# Patient Record
Sex: Female | Born: 1970 | Race: Black or African American | Hispanic: No | Marital: Married | Smoking: Never smoker
Health system: Southern US, Community
[De-identification: ages and names within clinical notes are randomized; demographics above are authoritative.]

## PROBLEM LIST (undated history)

## (undated) DIAGNOSIS — D649 Anemia, unspecified: Secondary | ICD-10-CM

## (undated) DIAGNOSIS — O09529 Supervision of elderly multigravida, unspecified trimester: Secondary | ICD-10-CM

## (undated) DIAGNOSIS — E669 Obesity, unspecified: Secondary | ICD-10-CM

## (undated) DIAGNOSIS — R87629 Unspecified abnormal cytological findings in specimens from vagina: Secondary | ICD-10-CM

## (undated) HISTORY — DX: Obesity, unspecified: E66.9

## (undated) HISTORY — PX: NO PAST SURGERIES: SHX2092

## (undated) HISTORY — DX: Unspecified abnormal cytological findings in specimens from vagina: R87.629

## (undated) HISTORY — DX: Anemia, unspecified: D64.9

## (undated) HISTORY — DX: Supervision of elderly multigravida, unspecified trimester: O09.529

---

## 2015-02-24 ENCOUNTER — Ambulatory Visit (HOSPITAL_COMMUNITY)
Admission: RE | Admit: 2015-02-24 | Discharge: 2015-02-24 | Disposition: A | Discharge status: Home / Self Care | Payer: Medicaid Other | Source: Ambulatory Visit | Attending: Physician Assistant | Admitting: Physician Assistant

## 2015-02-24 ENCOUNTER — Encounter (HOSPITAL_COMMUNITY): Payer: Self-pay

## 2015-02-24 ENCOUNTER — Other Ambulatory Visit (HOSPITAL_COMMUNITY): Payer: Self-pay | Admitting: Physician Assistant

## 2015-02-24 DIAGNOSIS — Z3689 Encounter for other specified antenatal screening: Secondary | ICD-10-CM

## 2015-02-24 DIAGNOSIS — Z36 Encounter for antenatal screening of mother: Secondary | ICD-10-CM | POA: Diagnosis not present

## 2015-02-24 DIAGNOSIS — O09523 Supervision of elderly multigravida, third trimester: Secondary | ICD-10-CM | POA: Diagnosis not present

## 2015-02-24 DIAGNOSIS — Z3A34 34 weeks gestation of pregnancy: Secondary | ICD-10-CM | POA: Insufficient documentation

## 2015-02-24 LAB — OB RESULTS CONSOLE ABO/RH: RH Type: POSITIVE

## 2015-02-24 LAB — OB RESULTS CONSOLE RUBELLA ANTIBODY, IGM: RUBELLA: IMMUNE

## 2015-02-24 LAB — OB RESULTS CONSOLE RPR: RPR: NONREACTIVE

## 2015-02-24 LAB — OB RESULTS CONSOLE GC/CHLAMYDIA
Chlamydia: NEGATIVE
Gonorrhea: NEGATIVE

## 2015-02-24 LAB — OB RESULTS CONSOLE HIV ANTIBODY (ROUTINE TESTING): HIV: NONREACTIVE

## 2015-02-24 LAB — OB RESULTS CONSOLE ANTIBODY SCREEN: ANTIBODY SCREEN: NEGATIVE

## 2015-02-24 LAB — OB RESULTS CONSOLE HEPATITIS B SURFACE ANTIGEN: HEP B S AG: NEGATIVE

## 2015-03-07 ENCOUNTER — Other Ambulatory Visit (HOSPITAL_COMMUNITY): Payer: Self-pay | Admitting: Nurse Practitioner

## 2015-03-07 DIAGNOSIS — Z3A38 38 weeks gestation of pregnancy: Secondary | ICD-10-CM

## 2015-03-07 DIAGNOSIS — O0933 Supervision of pregnancy with insufficient antenatal care, third trimester: Secondary | ICD-10-CM

## 2015-03-07 DIAGNOSIS — Z3A36 36 weeks gestation of pregnancy: Secondary | ICD-10-CM

## 2015-03-07 DIAGNOSIS — O09523 Supervision of elderly multigravida, third trimester: Secondary | ICD-10-CM

## 2015-03-07 DIAGNOSIS — Z3A37 37 weeks gestation of pregnancy: Secondary | ICD-10-CM

## 2015-03-07 LAB — OB RESULTS CONSOLE GBS: STREP GROUP B AG: NEGATIVE

## 2015-03-07 LAB — OB RESULTS CONSOLE GC/CHLAMYDIA
CHLAMYDIA, DNA PROBE: NEGATIVE
GC PROBE AMP, GENITAL: NEGATIVE

## 2015-03-10 ENCOUNTER — Ambulatory Visit (HOSPITAL_COMMUNITY)
Admission: RE | Admit: 2015-03-10 | Discharge: 2015-03-10 | Disposition: A | Discharge status: Home / Self Care | Payer: Medicaid Other | Source: Ambulatory Visit | Attending: Physician Assistant | Admitting: Physician Assistant

## 2015-03-10 ENCOUNTER — Encounter (HOSPITAL_COMMUNITY): Payer: Self-pay

## 2015-03-10 DIAGNOSIS — O0933 Supervision of pregnancy with insufficient antenatal care, third trimester: Secondary | ICD-10-CM | POA: Insufficient documentation

## 2015-03-10 DIAGNOSIS — Z3A36 36 weeks gestation of pregnancy: Secondary | ICD-10-CM | POA: Insufficient documentation

## 2015-03-10 DIAGNOSIS — O09523 Supervision of elderly multigravida, third trimester: Secondary | ICD-10-CM | POA: Insufficient documentation

## 2015-03-16 ENCOUNTER — Other Ambulatory Visit (HOSPITAL_COMMUNITY): Payer: Self-pay | Admitting: Nurse Practitioner

## 2015-03-16 ENCOUNTER — Ambulatory Visit (HOSPITAL_COMMUNITY)
Admission: RE | Admit: 2015-03-16 | Discharge: 2015-03-16 | Disposition: A | Discharge status: Home / Self Care | Payer: Medicaid Other | Source: Ambulatory Visit | Attending: Nurse Practitioner | Admitting: Nurse Practitioner

## 2015-03-16 DIAGNOSIS — O09523 Supervision of elderly multigravida, third trimester: Secondary | ICD-10-CM

## 2015-03-16 DIAGNOSIS — Z3A37 37 weeks gestation of pregnancy: Secondary | ICD-10-CM

## 2015-03-16 DIAGNOSIS — O0933 Supervision of pregnancy with insufficient antenatal care, third trimester: Secondary | ICD-10-CM | POA: Diagnosis not present

## 2015-03-17 ENCOUNTER — Ambulatory Visit (HOSPITAL_COMMUNITY): Admission: RE | Admit: 2015-03-17 | Payer: Self-pay | Source: Ambulatory Visit

## 2015-03-17 ENCOUNTER — Other Ambulatory Visit (HOSPITAL_COMMUNITY): Payer: Self-pay

## 2015-03-21 ENCOUNTER — Telehealth (HOSPITAL_COMMUNITY): Payer: Self-pay | Admitting: *Deleted

## 2015-03-21 ENCOUNTER — Encounter (HOSPITAL_COMMUNITY): Payer: Self-pay | Admitting: *Deleted

## 2015-03-21 NOTE — Telephone Encounter (Signed)
Preadmission screen Interpreter number [phone]

## 2015-03-24 ENCOUNTER — Encounter (HOSPITAL_COMMUNITY): Payer: Self-pay

## 2015-03-24 ENCOUNTER — Ambulatory Visit (HOSPITAL_COMMUNITY)
Admission: RE | Admit: 2015-03-24 | Discharge: 2015-03-24 | Disposition: A | Discharge status: Home / Self Care | Payer: Medicaid Other | Source: Ambulatory Visit | Attending: Physician Assistant | Admitting: Physician Assistant

## 2015-03-24 DIAGNOSIS — O09523 Supervision of elderly multigravida, third trimester: Secondary | ICD-10-CM | POA: Diagnosis present

## 2015-03-24 DIAGNOSIS — Z3A38 38 weeks gestation of pregnancy: Secondary | ICD-10-CM | POA: Diagnosis not present

## 2015-03-24 DIAGNOSIS — O0933 Supervision of pregnancy with insufficient antenatal care, third trimester: Secondary | ICD-10-CM | POA: Insufficient documentation

## 2015-03-26 ENCOUNTER — Encounter (HOSPITAL_COMMUNITY): Payer: Self-pay | Admitting: *Deleted

## 2015-03-26 ENCOUNTER — Inpatient Hospital Stay (HOSPITAL_COMMUNITY)
Admission: AD | Admit: 2015-03-26 | Discharge: 2015-03-29 | DRG: 766 | Disposition: A | Discharge status: Home / Self Care | Payer: Medicaid Other | Source: Ambulatory Visit | Attending: Obstetrics and Gynecology | Admitting: Obstetrics and Gynecology

## 2015-03-26 DIAGNOSIS — O09513 Supervision of elderly primigravida, third trimester: Secondary | ICD-10-CM

## 2015-03-26 DIAGNOSIS — O09612 Supervision of young primigravida, second trimester: Secondary | ICD-10-CM | POA: Diagnosis not present

## 2015-03-26 DIAGNOSIS — Z8249 Family history of ischemic heart disease and other diseases of the circulatory system: Secondary | ICD-10-CM

## 2015-03-26 DIAGNOSIS — O99214 Obesity complicating childbirth: Secondary | ICD-10-CM | POA: Diagnosis present

## 2015-03-26 DIAGNOSIS — E669 Obesity, unspecified: Secondary | ICD-10-CM | POA: Diagnosis present

## 2015-03-26 DIAGNOSIS — O328XX0 Maternal care for other malpresentation of fetus, not applicable or unspecified: Secondary | ICD-10-CM | POA: Diagnosis not present

## 2015-03-26 DIAGNOSIS — R319 Hematuria, unspecified: Secondary | ICD-10-CM | POA: Diagnosis present

## 2015-03-26 DIAGNOSIS — R87810 Cervical high risk human papillomavirus (HPV) DNA test positive: Secondary | ICD-10-CM | POA: Diagnosis present

## 2015-03-26 DIAGNOSIS — Z803 Family history of malignant neoplasm of breast: Secondary | ICD-10-CM

## 2015-03-26 DIAGNOSIS — O9902 Anemia complicating childbirth: Secondary | ICD-10-CM | POA: Diagnosis present

## 2015-03-26 DIAGNOSIS — O09529 Supervision of elderly multigravida, unspecified trimester: Secondary | ICD-10-CM

## 2015-03-26 DIAGNOSIS — Z98891 History of uterine scar from previous surgery: Secondary | ICD-10-CM

## 2015-03-26 DIAGNOSIS — Z6837 Body mass index (BMI) 37.0-37.9, adult: Secondary | ICD-10-CM

## 2015-03-26 DIAGNOSIS — O99814 Abnormal glucose complicating childbirth: Secondary | ICD-10-CM | POA: Diagnosis present

## 2015-03-26 DIAGNOSIS — Z3A39 39 weeks gestation of pregnancy: Secondary | ICD-10-CM | POA: Diagnosis present

## 2015-03-26 DIAGNOSIS — O0932 Supervision of pregnancy with insufficient antenatal care, second trimester: Secondary | ICD-10-CM | POA: Diagnosis not present

## 2015-03-26 LAB — CBC
HCT: 28 % — ABNORMAL LOW (ref 36.0–46.0)
HEMOGLOBIN: 9.4 g/dL — AB (ref 12.0–15.0)
MCH: 27.6 pg (ref 26.0–34.0)
MCHC: 33.6 g/dL (ref 30.0–36.0)
MCV: 82.4 fL (ref 78.0–100.0)
PLATELETS: 301 10*3/uL (ref 150–400)
RBC: 3.4 MIL/uL — AB (ref 3.87–5.11)
RDW: 14.1 % (ref 11.5–15.5)
WBC: 6.4 10*3/uL (ref 4.0–10.5)

## 2015-03-26 LAB — ABO/RH
ABO/RH(D): O POS
Weak D: POSITIVE

## 2015-03-26 MED ORDER — OXYCODONE-ACETAMINOPHEN 5-325 MG PO TABS: 1.0000 | ORAL_TABLET | ORAL | Status: DC | PRN

## 2015-03-26 MED ORDER — LACTATED RINGERS IV SOLN
500.0000 mL | INTRAVENOUS | Status: DC | PRN
  Administered 2015-03-27 (×5): 500 mL via INTRAVENOUS

## 2015-03-26 MED ORDER — LACTATED RINGERS IV SOLN
INTRAVENOUS | Status: DC
  Administered 2015-03-26 – 2015-03-27 (×6): via INTRAVENOUS

## 2015-03-26 MED ORDER — OXYTOCIN 40 UNITS IN LACTATED RINGERS INFUSION - SIMPLE MED
62.5000 mL/h | INTRAVENOUS | Status: DC
  Filled 2015-03-26 (×2): qty 1000

## 2015-03-26 MED ORDER — FLEET ENEMA 7-19 GM/118ML RE ENEM: 1.0000 | ENEMA | RECTAL | Status: DC | PRN

## 2015-03-26 MED ORDER — CITRIC ACID-SODIUM CITRATE 334-500 MG/5ML PO SOLN
30.0000 mL | ORAL | Status: DC | PRN
  Administered 2015-03-27: 30 mL via ORAL
  Filled 2015-03-26: qty 15

## 2015-03-26 MED ORDER — OXYCODONE-ACETAMINOPHEN 5-325 MG PO TABS: 2.0000 | ORAL_TABLET | ORAL | Status: DC | PRN

## 2015-03-26 MED ORDER — ONDANSETRON HCL 4 MG/2ML IJ SOLN
4.0000 mg | Freq: Four times a day (QID) | INTRAMUSCULAR | Status: DC | PRN
  Administered 2015-03-27: 4 mg via INTRAVENOUS

## 2015-03-26 MED ORDER — ZOLPIDEM TARTRATE 5 MG PO TABS
5.0000 mg | ORAL_TABLET | Freq: Once | ORAL | Status: DC
Start: 2015-03-26 — End: 2015-03-27

## 2015-03-26 MED ORDER — FENTANYL CITRATE (PF) 100 MCG/2ML IJ SOLN: 100.0000 ug | INTRAMUSCULAR | Status: DC | PRN

## 2015-03-26 MED ORDER — MISOPROSTOL 25 MCG QUARTER TABLET
25.0000 ug | ORAL_TABLET | ORAL | Status: DC
  Administered 2015-03-26: 25 ug via VAGINAL
  Filled 2015-03-26 (×4): qty 1
  Filled 2015-03-26: qty 0.25

## 2015-03-26 MED ORDER — LIDOCAINE HCL (PF) 1 % IJ SOLN
30.0000 mL | INTRAMUSCULAR | Status: DC | PRN
  Filled 2015-03-26: qty 30

## 2015-03-26 MED ORDER — ACETAMINOPHEN 325 MG PO TABS: 650.0000 mg | ORAL_TABLET | ORAL | Status: DC | PRN

## 2015-03-26 MED ORDER — OXYTOCIN 40 UNITS IN LACTATED RINGERS INFUSION - SIMPLE MED
1.0000 m[IU]/min | INTRAVENOUS | Status: DC
  Administered 2015-03-26: 2 m[IU]/min via INTRAVENOUS

## 2015-03-26 MED ORDER — OXYTOCIN BOLUS FROM INFUSION: 500.0000 mL | INTRAVENOUS | Status: DC

## 2015-03-26 MED ORDER — HYDROXYZINE HCL 50 MG PO TABS: 50.0000 mg | ORAL_TABLET | Freq: Four times a day (QID) | ORAL | Status: DC | PRN

## 2015-03-26 MED ORDER — TERBUTALINE SULFATE 1 MG/ML IJ SOLN: 0.2500 mg | Freq: Once | INTRAMUSCULAR | Status: DC | PRN

## 2015-03-26 NOTE — Progress Notes (Signed)
Pt unable to make MN appointment due to no child care. Here now and will begin IOL process.

## 2015-03-26 NOTE — H&P (Signed)
Holly Chan is a 44 y.o. G2P1001 female at [redacted]w[redacted]d by LMP c/w 34wk u/s, presenting for IOL d/t AMA.   Reports active fetal movement, contractions: irregular, vaginal bleeding: none, membranes: intact. Initiated prenatal care at [facility] at 34 wks.   Pregnancy complicated by AMA, obesity, elevated 1hr w/ normal 3hr gtt, anemia, ASCUS w/ -HRHPV pap.  H/O term uncomplicated SVB x 1 in [place]  Past Medical History: Past Medical History  Diagnosis Date  . AMA (advanced maternal age) multigravida 35+   . Anemia   . Obesity   . Vaginal Pap smear, abnormal     Past Surgical History: Past Surgical History  Procedure Laterality Date  . No past surgeries      Obstetrical History: OB History    Gravida Para Term Preterm AB TAB SAB Ectopic Multiple Living   0 0 0 0 0 0 1      Social History: Social History   Social History  . Marital Status: Married    Spouse Name: N/A  . Number of Children: N/A  . Years of Education: N/A   Social History Main Topics  . Smoking status: Never Smoker   . Smokeless tobacco: Never Used  . Alcohol Use: No  . Drug Use: No  . Sexual Activity: Yes    Birth Control/ Protection: None   Other Topics Concern  . None   Social History Narrative    Family History: Family History  Problem Relation Age of Onset  . Hypertension Mother   . Cancer Mother     breast  . Hypertension Father     Allergies: No Known Allergies  Prescriptions prior to admission  Medication Sig Dispense Refill Last Dose  . Prenatal Vit-Fe Fumarate-FA (PRENATAL MULTIVITAMIN) TABS tablet Take 1 tablet by mouth daily at 12 noon.   Taking     Review of Systems  Pertinent pos/neg as indicated in HPI    Height  (1.626 m), weight 97.523 kg (215 lb), last menstrual period 06/26/2014. General appearance: alert, cooperative and no distress Lungs: clear to auscultation bilaterally Heart: regular rate and rhythm Abdomen: gravid, soft, non-tender Extremities: Trace  edema DTR's 2+  Fetal monitoring: FHR: 150 bpm, variability: moderate,  Accelerations: Present,  decelerations:  Absent Uterine activity: irregular    Presentation: cephalic   Prenatal labs: ABO, Rh: O/Positive/-- (08/04 0000) Antibody: Negative (08/04 0000) Rubella:  Immune RPR: Nonreactive (08/04 0000)  HBsAg: Negative (08/04 0000)  HIV: Non-reactive (08/04 0000)  GBS: Negative (08/15 0000)   1 hr Glucola: 170 3hr GTT: 88/159/172/137 Genetic screening:  Too late Anatomy US: normal female  Assessment:  [redacted]w[redacted]d SIUP  G2P1001  IOL for AMA  Cat 1 FHR  GBS Negative (08/15 0000)   Late PNC  Plan:  Admit to BS  IV pain meds/epidural prn active labor  Pitocin per protocol  Anticipate NSVB    [name] CNM, WHNP-BC 03/26/2015, 10:08 AM

## 2015-03-26 NOTE — Progress Notes (Signed)
Patient ID: Holly Chan, female   DOB: 09/17/1970, 44 y.o.   MRN: [id number] Holly Chan is a 44 y.o. G2P1001 at [redacted]w[redacted]d admitted for induction of labor due to [facility].  Subjective: Feeling contractions, not uncomfortable  Objective: BP 106/71 mmHg  Pulse 67  Temp(Src) 98.4 F (36.9 C) (Oral)  Resp 15  Ht  (1.626 m)  Wt 97.523 kg (215 lb)  BMI 36.89 kg/m2  LMP 06/26/2014 (Exact Date)    FHT:  FHR: 135 bpm, variability: moderate,  accelerations:  Present,  decelerations:  Absent UC:   regular, every 2-4 minutes  SVE:   Dilation: 3 Effacement (%): 40 Station: -2 Exam by:: K Booker CNM  Pitocin @ 8 mu/min  Labs: Lab Results  Component Value Date   WBC 6.4 03/26/2015   HGB 9.4* 03/26/2015   HCT 28.0* 03/26/2015   MCV 82.4 03/26/2015   PLT 301 03/26/2015    Assessment / Plan: IOL for AMA, pitocin @ 64mu/min, no cervical change- will continue increasing pitocin per protocol, then arom  Labor: early Fetal Wellbeing:  Category I Pain Control:  Labor support without medications Pre-eclampsia: n/a I/D:  n/a Anticipated MOD:  NSVD  [name] CNM, WHNP-BC 03/26/2015, 3:30 PM

## 2015-03-27 ENCOUNTER — Inpatient Hospital Stay (HOSPITAL_COMMUNITY): Payer: Medicaid Other | Admitting: Anesthesiology

## 2015-03-27 ENCOUNTER — Encounter (HOSPITAL_COMMUNITY): Payer: Self-pay | Admitting: Anesthesiology

## 2015-03-27 ENCOUNTER — Encounter (HOSPITAL_COMMUNITY)
Admission: AD | Disposition: A | Discharge status: Home / Self Care | Payer: Self-pay | Source: Ambulatory Visit | Attending: Obstetrics and Gynecology

## 2015-03-27 DIAGNOSIS — Z3A24 24 weeks gestation of pregnancy: Secondary | ICD-10-CM

## 2015-03-27 DIAGNOSIS — O09612 Supervision of young primigravida, second trimester: Secondary | ICD-10-CM

## 2015-03-27 DIAGNOSIS — O0932 Supervision of pregnancy with insufficient antenatal care, second trimester: Secondary | ICD-10-CM

## 2015-03-27 DIAGNOSIS — O328XX0 Maternal care for other malpresentation of fetus, not applicable or unspecified: Secondary | ICD-10-CM

## 2015-03-27 LAB — RPR: RPR Ser Ql: NONREACTIVE

## 2015-03-27 LAB — HIV ANTIBODY (ROUTINE TESTING W REFLEX): HIV Screen 4th Generation wRfx: NONREACTIVE

## 2015-03-27 LAB — PREPARE RBC (CROSSMATCH)

## 2015-03-27 SURGERY — Surgical Case
Anesthesia: Epidural | Site: Abdomen

## 2015-03-27 MED ORDER — MORPHINE SULFATE 0.5 MG/ML IJ SOLN
INTRAMUSCULAR | Status: AC
  Filled 2015-03-27: qty 100

## 2015-03-27 MED ORDER — ONDANSETRON HCL 4 MG/2ML IJ SOLN
INTRAMUSCULAR | Status: AC
  Filled 2015-03-27: qty 2

## 2015-03-27 MED ORDER — DIBUCAINE 1 % RE OINT: 1.0000 "application " | TOPICAL_OINTMENT | RECTAL | Status: DC | PRN

## 2015-03-27 MED ORDER — NALBUPHINE HCL 10 MG/ML IJ SOLN
5.0000 mg | INTRAMUSCULAR | Status: DC | PRN
  Filled 2015-03-27: qty 0.5

## 2015-03-27 MED ORDER — MENTHOL 3 MG MT LOZG: 1.0000 | LOZENGE | OROMUCOSAL | Status: DC | PRN

## 2015-03-27 MED ORDER — ACETAMINOPHEN 325 MG PO TABS: 650.0000 mg | ORAL_TABLET | ORAL | Status: DC | PRN

## 2015-03-27 MED ORDER — DIPHENHYDRAMINE HCL 25 MG PO CAPS: 25.0000 mg | ORAL_CAPSULE | Freq: Four times a day (QID) | ORAL | Status: DC | PRN

## 2015-03-27 MED ORDER — CEFAZOLIN SODIUM-DEXTROSE 2-3 GM-% IV SOLR
INTRAVENOUS | Status: AC
  Filled 2015-03-27: qty 50

## 2015-03-27 MED ORDER — SIMETHICONE 80 MG PO CHEW
80.0000 mg | CHEWABLE_TABLET | ORAL | Status: DC
  Administered 2015-03-28: 80 mg via ORAL
  Filled 2015-03-27: qty 1

## 2015-03-27 MED ORDER — NALOXONE HCL 1 MG/ML IJ SOLN: 1.0000 ug/kg/h | INTRAVENOUS | Status: DC | PRN

## 2015-03-27 MED ORDER — MORPHINE SULFATE (PF) 0.5 MG/ML IJ SOLN
INTRAMUSCULAR | Status: DC | PRN
  Administered 2015-03-27: 3 mg via EPIDURAL

## 2015-03-27 MED ORDER — SODIUM BICARBONATE 8.4 % IV SOLN
INTRAVENOUS | Status: DC | PRN
  Administered 2015-03-27: 5 mL via EPIDURAL

## 2015-03-27 MED ORDER — TERBUTALINE SULFATE 1 MG/ML IJ SOLN
0.2500 mg | Freq: Once | INTRAMUSCULAR | Status: AC | PRN
  Administered 2015-03-27: 0.25 mg via SUBCUTANEOUS
  Filled 2015-03-27: qty 1

## 2015-03-27 MED ORDER — DIPHENHYDRAMINE HCL 50 MG/ML IJ SOLN: 12.5000 mg | INTRAMUSCULAR | Status: DC | PRN

## 2015-03-27 MED ORDER — SODIUM BICARBONATE 8.4 % IV SOLN
INTRAVENOUS | Status: AC
  Filled 2015-03-27: qty 50

## 2015-03-27 MED ORDER — NALBUPHINE HCL 10 MG/ML IJ SOLN
5.0000 mg | Freq: Once | INTRAMUSCULAR | Status: DC | PRN
  Filled 2015-03-27: qty 0.5

## 2015-03-27 MED ORDER — OXYTOCIN 10 UNIT/ML IJ SOLN
40.0000 [IU] | INTRAVENOUS | Status: DC | PRN
  Administered 2015-03-27: 40 [IU] via INTRAVENOUS

## 2015-03-27 MED ORDER — SODIUM CHLORIDE 0.9 % IJ SOLN: 3.0000 mL | INTRAMUSCULAR | Status: DC | PRN

## 2015-03-27 MED ORDER — PRENATAL MULTIVITAMIN CH
1.0000 | ORAL_TABLET | Freq: Every day | ORAL | Status: DC
  Administered 2015-03-28 – 2015-03-29 (×2): 1 via ORAL
  Filled 2015-03-27 (×2): qty 1

## 2015-03-27 MED ORDER — WITCH HAZEL-GLYCERIN EX PADS: 1.0000 "application " | MEDICATED_PAD | CUTANEOUS | Status: DC | PRN

## 2015-03-27 MED ORDER — ZOLPIDEM TARTRATE 5 MG PO TABS: 5.0000 mg | ORAL_TABLET | Freq: Every evening | ORAL | Status: DC | PRN

## 2015-03-27 MED ORDER — NALOXONE HCL 0.4 MG/ML IJ SOLN: 0.4000 mg | INTRAMUSCULAR | Status: DC | PRN

## 2015-03-27 MED ORDER — IBUPROFEN 600 MG PO TABS
600.0000 mg | ORAL_TABLET | Freq: Four times a day (QID) | ORAL | Status: DC
  Administered 2015-03-28 – 2015-03-29 (×5): 600 mg via ORAL
  Filled 2015-03-27 (×5): qty 1

## 2015-03-27 MED ORDER — LANOLIN HYDROUS EX OINT: 1.0000 "application " | TOPICAL_OINTMENT | CUTANEOUS | Status: DC | PRN

## 2015-03-27 MED ORDER — LIDOCAINE-EPINEPHRINE (PF) 2 %-1:200000 IJ SOLN
INTRAMUSCULAR | Status: AC
  Filled 2015-03-27: qty 20

## 2015-03-27 MED ORDER — SCOPOLAMINE 1 MG/3DAYS TD PT72
MEDICATED_PATCH | TRANSDERMAL | Status: AC
  Filled 2015-03-27: qty 1

## 2015-03-27 MED ORDER — LIDOCAINE HCL (PF) 1 % IJ SOLN
INTRAMUSCULAR | Status: DC | PRN
  Administered 2015-03-27 (×2): 4 mL via EPIDURAL

## 2015-03-27 MED ORDER — OXYTOCIN 40 UNITS IN LACTATED RINGERS INFUSION - SIMPLE MED: 62.5000 mL/h | INTRAVENOUS | Status: AC

## 2015-03-27 MED ORDER — CEFAZOLIN SODIUM-DEXTROSE 2-3 GM-% IV SOLR
INTRAVENOUS | Status: DC | PRN
  Administered 2015-03-27: 2 g via INTRAVENOUS

## 2015-03-27 MED ORDER — OXYTOCIN 10 UNIT/ML IJ SOLN
INTRAMUSCULAR | Status: AC
  Filled 2015-03-27: qty 4

## 2015-03-27 MED ORDER — OXYCODONE-ACETAMINOPHEN 5-325 MG PO TABS
1.0000 | ORAL_TABLET | ORAL | Status: DC | PRN
  Administered 2015-03-29: 1 via ORAL
  Filled 2015-03-27: qty 1

## 2015-03-27 MED ORDER — DIPHENHYDRAMINE HCL 25 MG PO CAPS: 25.0000 mg | ORAL_CAPSULE | ORAL | Status: DC | PRN

## 2015-03-27 MED ORDER — SENNOSIDES-DOCUSATE SODIUM 8.6-50 MG PO TABS
2.0000 | ORAL_TABLET | ORAL | Status: DC
  Administered 2015-03-28: 2 via ORAL
  Filled 2015-03-27: qty 2

## 2015-03-27 MED ORDER — EPHEDRINE 5 MG/ML INJ
10.0000 mg | INTRAVENOUS | Status: DC | PRN
  Administered 2015-03-27: 10 mg via INTRAVENOUS
  Filled 2015-03-27: qty 4

## 2015-03-27 MED ORDER — FENTANYL 2.5 MCG/ML BUPIVACAINE 1/10 % EPIDURAL INFUSION (WH - ANES)
14.0000 mL/h | INTRAMUSCULAR | Status: DC | PRN
  Administered 2015-03-27 (×2): 14 mL/h via EPIDURAL
  Filled 2015-03-27 (×2): qty 125

## 2015-03-27 MED ORDER — OXYTOCIN 40 UNITS IN LACTATED RINGERS INFUSION - SIMPLE MED
1.0000 m[IU]/min | INTRAVENOUS | Status: DC
  Administered 2015-03-27: 2 m[IU]/min via INTRAVENOUS
  Administered 2015-03-27: 4 m[IU]/min via INTRAVENOUS

## 2015-03-27 MED ORDER — PROMETHAZINE HCL 25 MG/ML IJ SOLN
6.2500 mg | Freq: Once | INTRAMUSCULAR | Status: DC | PRN
  Administered 2015-03-27: 6.25 mg via INTRAVENOUS
  Filled 2015-03-27: qty 1

## 2015-03-27 MED ORDER — LACTATED RINGERS IV SOLN
INTRAVENOUS | Status: DC
  Administered 2015-03-27 – 2015-03-28 (×2): via INTRAVENOUS

## 2015-03-27 MED ORDER — PHENYLEPHRINE 40 MCG/ML (10ML) SYRINGE FOR IV PUSH (FOR BLOOD PRESSURE SUPPORT): 80.0000 ug | PREFILLED_SYRINGE | INTRAVENOUS | Status: DC | PRN

## 2015-03-27 MED ORDER — KETOROLAC TROMETHAMINE 30 MG/ML IJ SOLN
30.0000 mg | Freq: Four times a day (QID) | INTRAMUSCULAR | Status: AC | PRN
  Administered 2015-03-27 – 2015-03-28 (×2): 30 mg via INTRAVENOUS
  Filled 2015-03-27 (×2): qty 1

## 2015-03-27 MED ORDER — KETOROLAC TROMETHAMINE 30 MG/ML IJ SOLN: 30.0000 mg | Freq: Four times a day (QID) | INTRAMUSCULAR | Status: AC | PRN

## 2015-03-27 MED ORDER — NALBUPHINE HCL 10 MG/ML IJ SOLN: 5.0000 mg | Freq: Once | INTRAMUSCULAR | Status: DC | PRN

## 2015-03-27 MED ORDER — TETANUS-DIPHTH-ACELL PERTUSSIS 5-2.5-18.5 LF-MCG/0.5 IM SUSP: 0.5000 mL | Freq: Once | INTRAMUSCULAR | Status: DC

## 2015-03-27 MED ORDER — PHENYLEPHRINE 40 MCG/ML (10ML) SYRINGE FOR IV PUSH (FOR BLOOD PRESSURE SUPPORT)
80.0000 ug | PREFILLED_SYRINGE | INTRAVENOUS | Status: DC | PRN
  Filled 2015-03-27 (×2): qty 20

## 2015-03-27 MED ORDER — SIMETHICONE 80 MG PO CHEW: 80.0000 mg | CHEWABLE_TABLET | ORAL | Status: DC | PRN

## 2015-03-27 MED ORDER — SIMETHICONE 80 MG PO CHEW
80.0000 mg | CHEWABLE_TABLET | Freq: Three times a day (TID) | ORAL | Status: DC
  Administered 2015-03-28 – 2015-03-29 (×4): 80 mg via ORAL
  Filled 2015-03-27 (×2): qty 1

## 2015-03-27 MED ORDER — OXYCODONE-ACETAMINOPHEN 5-325 MG PO TABS: 2.0000 | ORAL_TABLET | ORAL | Status: DC | PRN

## 2015-03-27 MED ORDER — SCOPOLAMINE 1 MG/3DAYS TD PT72
MEDICATED_PATCH | TRANSDERMAL | Status: DC | PRN
  Administered 2015-03-27: 1 via TRANSDERMAL

## 2015-03-27 MED ORDER — FENTANYL 2.5 MCG/ML BUPIVACAINE 1/10 % EPIDURAL INFUSION (WH - ANES): 14.0000 mL/h | INTRAMUSCULAR | Status: DC | PRN

## 2015-03-27 MED ORDER — LACTATED RINGERS IV SOLN
INTRAVENOUS | Status: DC | PRN
  Administered 2015-03-27: 17:00:00 via INTRAVENOUS

## 2015-03-27 MED ORDER — PROMETHAZINE HCL 25 MG/ML IJ SOLN: 6.2500 mg | INTRAMUSCULAR | Status: DC | PRN

## 2015-03-27 MED ORDER — MEPERIDINE HCL 25 MG/ML IJ SOLN: 6.2500 mg | INTRAMUSCULAR | Status: DC | PRN

## 2015-03-27 MED ORDER — ONDANSETRON HCL 4 MG/2ML IJ SOLN: 4.0000 mg | Freq: Three times a day (TID) | INTRAMUSCULAR | Status: DC | PRN

## 2015-03-27 SURGICAL SUPPLY — 33 items
BENZOIN TINCTURE PRP APPL 2/3 (GAUZE/BANDAGES/DRESSINGS) ×3 IMPLANT
CLAMP CORD UMBIL (MISCELLANEOUS) IMPLANT
CLOSURE WOUND 1/2 X4 (GAUZE/BANDAGES/DRESSINGS) ×1
CLOTH BEACON ORANGE TIMEOUT ST (SAFETY) ×3 IMPLANT
DRAPE SHEET LG 3/4 BI-LAMINATE (DRAPES) IMPLANT
DRSG OPSITE POSTOP 4X10 (GAUZE/BANDAGES/DRESSINGS) ×3 IMPLANT
DURAPREP 26ML APPLICATOR (WOUND CARE) ×3 IMPLANT
ELECT REM PT RETURN 9FT ADLT (ELECTROSURGICAL) ×3
ELECTRODE REM PT RTRN 9FT ADLT (ELECTROSURGICAL) ×1 IMPLANT
EXTRACTOR VACUUM KIWI (MISCELLANEOUS) IMPLANT
GLOVE BIO SURGEON ST LM GN SZ9 (GLOVE) ×3 IMPLANT
GLOVE BIOGEL PI IND STRL 9 (GLOVE) ×1 IMPLANT
GLOVE BIOGEL PI INDICATOR 9 (GLOVE) ×2
GOWN STRL REUS W/TWL 2XL LVL3 (GOWN DISPOSABLE) ×3 IMPLANT
GOWN STRL REUS W/TWL LRG LVL3 (GOWN DISPOSABLE) ×3 IMPLANT
NEEDLE HYPO 25X5/8 SAFETYGLIDE (NEEDLE) IMPLANT
NS IRRIG 1000ML POUR BTL (IV SOLUTION) ×3 IMPLANT
PACK C SECTION WH (CUSTOM PROCEDURE TRAY) ×3 IMPLANT
PAD OB MATERNITY 4.3X12.25 (PERSONAL CARE ITEMS) ×3 IMPLANT
PENCIL SMOKE EVAC W/HOLSTER (ELECTROSURGICAL) IMPLANT
RTRCTR C-SECT PINK 25CM LRG (MISCELLANEOUS) ×3 IMPLANT
RTRCTR C-SECT PINK 34CM XLRG (MISCELLANEOUS) IMPLANT
STRIP CLOSURE SKIN 1/2X4 (GAUZE/BANDAGES/DRESSINGS) ×2 IMPLANT
SUT MNCRL 0 VIOLET CTX 36 (SUTURE) ×2 IMPLANT
SUT MONOCRYL 0 CTX 36 (SUTURE) ×4
SUT VIC AB 0 CT1 27 (SUTURE) ×2
SUT VIC AB 0 CT1 27XBRD ANBCTR (SUTURE) ×1 IMPLANT
SUT VIC AB 2-0 CT1 27 (SUTURE) ×2
SUT VIC AB 2-0 CT1 TAPERPNT 27 (SUTURE) ×1 IMPLANT
SUT VIC AB 4-0 KS 27 (SUTURE) ×3 IMPLANT
SYR BULB IRRIGATION 50ML (SYRINGE) IMPLANT
TOWEL OR 17X24 6PK STRL BLUE (TOWEL DISPOSABLE) ×3 IMPLANT
TRAY FOLEY CATH SILVER 14FR (SET/KITS/TRAYS/PACK) IMPLANT

## 2015-03-27 NOTE — Progress Notes (Signed)
Kerin Kren is a 44 y.o. G2P1001 at [redacted]w[redacted]d  admitted for induction of labor due to Advanced maternal age..  Subjective: pt having repetitive lates with most all contractions over the last hour, with beat to beat initially fine, but now diminishing at times.  Pt has had adequate labor, with IUPC in place. Cervical change has arrested, was 5.5 at 7:30 , progressed to 6 but no significant further change. Station by me remains -2 or higher. No thrust /descent of vertex with contractions. U/s confirms a rather military position of vertex, LOT.   Bladder urine shows start of hematuria, and foley bulb in bladder able to be pushed above the vertex and deep decel with subsequent contractions Pelvic clinically narrow. Objective: BP 98/64 mmHg  Pulse 68  Temp(Src) 98.8 F (37.1 C) (Oral)  Resp 18  Ht  (1.626 m)  Wt 97.523 kg (215 lb)  BMI 36.89 kg/m2  SpO2 98%  LMP 06/26/2014 (Exact Date)   Total I/O In: -  Out: 1000 [Urine:1000]  FHT:  FHR: 150 bpm, variability: minimal ,  accelerations:  Present,  decelerations:  Present lates UC:   regular, every 3 minutes SVE:   Dilation: 6.5 Effacement (%): 50 Station: -2 Exam by:: Ferg  Labs: Lab Results  Component Value Date   WBC 6.4 03/26/2015   HGB 9.4* 03/26/2015   HCT 28.0* 03/26/2015   MCV 82.4 03/26/2015   PLT 301 03/26/2015    Assessment / Plan: Arrest in active phase of labor, Fetal intolerance of labor  Labor: stopped , given terbutalene for Intrauterine recovery Preeclampsia:   Fetal Wellbeing:  Category II Pain Control:  Epidural I/D:  n/a Anticipated MOD:  Cesarean section recommended, and accepted. risks reviewed thru translator. type and cross initiated due to low hgb. To OR for cesarean.  Avyan Livesay V 03/27/2015, 4:58 PM

## 2015-03-27 NOTE — Transfer of Care (Signed)
Immediate Anesthesia Transfer of Care Note  Patient: Holly Chan  Procedure(s) Performed: Procedure(s): CESAREAN SECTION (N/A)  Patient Location: PACU  Anesthesia Type:Epidural  Level of Consciousness: awake  Airway & Oxygen Therapy: Patient Spontanous Breathing  Post-op Assessment: Report given to RN and Post -op Vital signs reviewed and stable  Post vital signs: stable  Last Vitals:  Filed Vitals:   03/27/15 1701  BP: 116/70  Pulse: 89  Temp:   Resp:     Complications: No apparent anesthesia complications

## 2015-03-27 NOTE — Progress Notes (Signed)
Patient ID: Holly Chan, female   DOB: 04/02/1971, 44 y.o.   MRN: [id number] Holly Chan is a 44 y.o. G2P1001 at [redacted]w[redacted]d admitted for induction of labor due to [facility].   Subjective: Comfortable w/ epidural, no complaints  Objective: BP 94/52 mmHg  Pulse 71  Temp(Src) 98.8 F (37.1 C) (Oral)  Resp 18  Ht  (1.626 m)  Wt 97.523 kg (215 lb)  BMI 36.89 kg/m2  SpO2 98%  LMP 06/26/2014 (Exact Date)    FHT:  FHR: 135 bpm, variability: moderate,  accelerations:  Present,  decelerations:  Present occ variables UC:   3-7  SVE:   Dilation: 5.5 Effacement (%): 90 Station: -2 Exam by:: Joellyn Haff, MD   Labs: Lab Results  Component Value Date   WBC 6.4 03/26/2015   HGB 9.4* 03/26/2015   HCT 28.0* 03/26/2015   MCV 82.4 03/26/2015   PLT 301 03/26/2015    Assessment / Plan: IOL d/t AMA, received 2 doses of pv cytotec, last dose @ 2230, wasn't able to get 0230 dose d/t contracting too much, began hurting and got epidural around 0430- got called by nurse after for variables/lates- bp was 80s/50s although she did have some lower bp's on admit- got a dose of ephedrine- strip is improving  Labor: Progressing normally Fetal Wellbeing:  Category II Pain Control:  Epidural Pre-eclampsia: n/a I/D:  n/a Anticipated MOD:  NSVD  [name] CNM, WHNP-BC 03/27/2015, 7:46 AM

## 2015-03-27 NOTE — Op Note (Signed)
-   03/27/2015  5:55 PM  PATIENT:  Holly Chan  44 y.o. female  PRE-OPERATIVE DIAGNOSIS:  Nonreassuring Fetal Status, CPD, pregnancy 40 weeks  POST-OPERATIVE DIAGNOSIS:  Nonreassuring Fetal Status, CPD  PROCEDURE:  Procedure(s): CESAREAN SECTION (N/A)  SURGEON:  Surgeon(s) and Role:    * Tilda Burrow, MD - Primary  PHYSICIAN ASSISTANT:   ASSISTANTS: none   ANESTHESIA:   epidural  EBL:  Total I/O In: 2000 [I.V.:2000] Out: 1675 [Urine:1075; Blood:600]  BLOOD ADMINISTERED:none  DRAINS: Urinary Catheter (Foley)   LOCAL MEDICATIONS USED:  NONE  SPECIMEN:  Placenta to labor and delivery  DISPOSITION OF SPECIMEN:  Labor and delivery  COUNTS:  YES  TOURNIQUET:  * No tourniquets in log *  DICTATION: .Dragon Dictation  PLAN OF CARE: Has admission orders  PATIENT DISPOSITION:  PACU - hemodynamically stable.   Delay start of Pharmacological VTE agent (>24hrs) due to surgical blood loss or risk of bleeding: not applicable Indications: 44 year old female admitted yesterday for induction of labor for advanced maternal age at EDD, induced reaching 5.5 cm at 7 AM, progressing minimally thereafter developing repetitive late decelerations responsive to intrauterine infusion. This baby is over a pound larger than last and largest delivery 13 years ago, with presenting part at -2 or higher without descent with contractions. Details of procedure: Patient was taken operating room prepped and draped for lower abdominal surgery with epidural anesthesia topped up, tested timeout conducted Ancef administered and transverse uterine incision made on the lower abdomen with sharp dissection to the fascia which was opened transversely and peritoneum opened sharply in the midline. Alexis wound retractor was positioned, transverse uterine incision performed and extended vertically and cephalad and caudad. Amniotic fluid was without malodor. Incision was at the level of the fetal ear. The fetus was  rotated from left occiput transverse position into the incision and delivered with fundal pressure and axillary traction. Cord was clamped and the baby passed to the waiting pediatrician see the notes for details. Placenta delivered easily and response to Pitocin and could a uterine massage. Membranes were intact. The uterus was irrigated. Closure was first layer running locking 0 Monocryl followed by continuous running 0 Monocryl second layer.. Abdomen was irrigated, closed with 2-0 Vicryl, fascia closed with 0 Vicryl, subcutaneous tissues reapproximated with interrupted 2-0 plain times 3 interrupted sutures then subcuticular 4-0 Vicryl skin cllosure completed the procedure. Steri-Strips and benzoin will be applied and patient to recovery room in stable condition with sponge and needles counts correct. EBL quite low estimated 600 cc per anesthesia.

## 2015-03-27 NOTE — Plan of Care (Signed)
Problem: Phase I Progression Outcomes Goal: OOB as tolerated unless otherwise ordered Outcome: Completed/Met Date Met:  03/27/15 Epidural - Bedrest

## 2015-03-27 NOTE — Progress Notes (Signed)
Holly Chan is a 44 y.o. G2P1001 at [redacted]w[redacted]d by ultrasound admitted for induction of labor due to [facility].  Subjective:   Objective: BP 99/53 mmHg  Pulse 70  Temp(Src) 98.5 F (36.9 C) (Oral)  Resp 18  Ht  (1.626 m)  Wt 215 lb (97.523 kg)  BMI 36.89 kg/m2  SpO2 98%  LMP 06/26/2014 (Exact Date)      FHT:  FHR: 145 bpm, variability: moderate,  accelerations:  Present,  decelerations:  Absent UC:   irregular, every 8-10 minutes SVE:   Dilation: 5.5 Effacement (%): 90 Station: -2 Exam by:: Joellyn Haff, MD  Labs: Lab Results  Component Value Date   WBC 6.4 03/26/2015   HGB 9.4* 03/26/2015   HCT 28.0* 03/26/2015   MCV 82.4 03/26/2015   PLT 301 03/26/2015    Assessment / Plan: IOL for AMA, IUPC placed will start pit if uc's inadequate  Labor: Progressing normally Preeclampsia:  no signs or symptoms of toxicity Fetal Wellbeing:  Category I Pain Control:  Epidural I/D:  n/a Anticipated MOD:  NSVD  Holly Chan Holly Chan 03/27/2015, 9:32 AM

## 2015-03-27 NOTE — Anesthesia Preprocedure Evaluation (Signed)
Anesthesia Evaluation  Patient identified by MRN, date of birth, ID band Patient awake    Reviewed: Allergy & Precautions, Patient's Chart, lab work & pertinent test results  Airway Mallampati: III  TM Distance: >3 FB Neck ROM: Full    Dental no notable dental hx. (+) Teeth Intact   Pulmonary neg pulmonary ROS,  breath sounds clear to auscultation  Pulmonary exam normal       Cardiovascular negative cardio ROS Normal cardiovascular examRhythm:Regular Rate:Normal     Neuro/Psych negative neurological ROS  negative psych ROS   GI/Hepatic Neg liver ROS, GERD-  ,  Endo/Other  Obesity  Renal/GU negative Renal ROS  negative genitourinary   Musculoskeletal negative musculoskeletal ROS (+)   Abdominal (+) + obese,   Peds  Hematology  (+) anemia ,   Anesthesia Other Findings   Reproductive/Obstetrics (+) Pregnancy AMA                             Anesthesia Physical Anesthesia Plan  ASA: II  Anesthesia Plan: Epidural   Post-op Pain Management:    Induction:   Airway Management Planned: Natural Airway  Additional Equipment:   Intra-op Plan:   Post-operative Plan:   Informed Consent: I have reviewed the patients History and Physical, chart, labs and discussed the procedure including the risks, benefits and alternatives for the proposed anesthesia with the patient or authorized representative who has indicated his/her understanding and acceptance.     Plan Discussed with: Anesthesiologist  Anesthesia Plan Comments:         Anesthesia Quick Evaluation

## 2015-03-27 NOTE — Brief Op Note (Signed)
03/26/2015 - 03/27/2015  5:55 PM  PATIENT:  Holly Chan  44 y.o. female  PRE-OPERATIVE DIAGNOSIS:  Nonreassuring Fetal Status, CPD, pregnancy 40 weeks  POST-OPERATIVE DIAGNOSIS:  Nonreassuring Fetal Status, CPD  PROCEDURE:  Procedure(s): CESAREAN SECTION (N/A)  SURGEON:  Surgeon(s) and Role:    * Tilda Burrow, MD - Primary  PHYSICIAN ASSISTANT:   ASSISTANTS: none   ANESTHESIA:   epidural  EBL:  Total I/O In: 2000 [I.V.:2000] Out: 1675 [Urine:1075; Blood:600]  BLOOD ADMINISTERED:none  DRAINS: Urinary Catheter (Foley)   LOCAL MEDICATIONS USED:  NONE  SPECIMEN:  Placenta to labor and delivery  DISPOSITION OF SPECIMEN:  Labor and delivery  COUNTS:  YES  TOURNIQUET:  * No tourniquets in log *  DICTATION: .Dragon Dictation  PLAN OF CARE: Has admission orders  PATIENT DISPOSITION:  PACU - hemodynamically stable.   Delay start of Pharmacological VTE agent (>24hrs) due to surgical blood loss or risk of bleeding: not applicable

## 2015-03-27 NOTE — Anesthesia Procedure Notes (Signed)
Epidural Patient location during procedure: OB Start time: 03/27/2015 4:27 AM  Staffing Anesthesiologist: Mal Amabile Performed by: anesthesiologist   Preanesthetic Checklist Completed: patient identified, site marked, surgical consent, pre-op evaluation, timeout performed, IV checked, risks and benefits discussed and monitors and equipment checked  Epidural Patient position: sitting Prep: site prepped and draped and DuraPrep Patient monitoring: continuous pulse ox and blood pressure Approach: midline Location: L3-L4 Injection technique: LOR air  Needle:  Needle type: Tuohy  Needle gauge: 17 G Needle length: 9 cm and 9 Needle insertion depth: 5 cm cm Catheter type: closed end flexible Catheter size: 19 Gauge Catheter at skin depth: 10 cm Test dose: negative and Other  Assessment Events: blood not aspirated, injection not painful, no injection resistance, negative IV test and no paresthesia  Additional Notes Patient identified. Risks and benefits discussed including failed block, incomplete  Pain control, post dural puncture headache, nerve damage, paralysis, blood pressure Changes, nausea, vomiting, reactions to medications-both toxic and allergic and post Partum back pain. All questions were answered. Patient expressed understanding and wished to proceed. Sterile technique was used throughout procedure. Epidural site was Dressed with sterile barrier dressing. No paresthesias, signs of intravascular injection Or signs of intrathecal spread were encountered.  Patient was more comfortable after the epidural was dosed. Please see RN's note for documentation of vital signs and FHR which are stable.

## 2015-03-27 NOTE — Progress Notes (Signed)
Holly Chan is a 44 y.o. G2P1001 at [redacted]w[redacted]d by ultrasound admitted for induction of labor due to [facility].  Subjective:   Objective: BP 100/59 mmHg  Pulse 68  Temp(Src) 98.5 F (36.9 C) (Oral)  Resp 18  Ht  (1.626 m)  Wt 215 lb (97.523 kg)  BMI 36.89 kg/m2  SpO2 98%  LMP 06/26/2014 (Exact Date)   Total I/O In: -  Out: 1000 [Urine:1000]  FHT:  FHR: 145 bpm, variability: moderate,  accelerations:  Present,  decelerations:  Present early UC:   regular, every 3-5 minutes SVE:   Dilation: 6 Effacement (%): 70 Station: -1 Exam by:: SmithRN  Labs: Lab Results  Component Value Date   WBC 6.4 03/26/2015   HGB 9.4* 03/26/2015   HCT 28.0* 03/26/2015   MCV 82.4 03/26/2015   PLT 301 03/26/2015    Assessment / Plan: Induction of labor due to AMA,  progressing well on pitocin  Labor: Progressing normally Preeclampsia:  no signs or symptoms of toxicity Fetal Wellbeing:  Category II Pain Control:  Epidural I/D:  n/a Anticipated MOD:  NSVD  Holly Chan 03/27/2015, 2:17 PM

## 2015-03-28 LAB — CBC
HEMATOCRIT: 27.5 % — AB (ref 36.0–46.0)
HEMOGLOBIN: 8.9 g/dL — AB (ref 12.0–15.0)
MCH: 27.2 pg (ref 26.0–34.0)
MCHC: 32.4 g/dL (ref 30.0–36.0)
MCV: 84.1 fL (ref 78.0–100.0)
Platelets: 280 10*3/uL (ref 150–400)
RBC: 3.27 MIL/uL — ABNORMAL LOW (ref 3.87–5.11)
RDW: 14.5 % (ref 11.5–15.5)
WBC: 14.8 10*3/uL — AB (ref 4.0–10.5)

## 2015-03-28 MED ORDER — INFLUENZA VAC SPLIT QUAD 0.5 ML IM SUSY
0.5000 mL | PREFILLED_SYRINGE | Freq: Once | INTRAMUSCULAR | Status: AC
  Administered 2015-03-28: 0.5 mL via INTRAMUSCULAR
  Filled 2015-03-28: qty 0.5

## 2015-03-28 NOTE — Lactation Note (Signed)
This note was copied from the chart of Holly Yatziry Pinzon. Lactation Consultation Note: [place] Interpreter on [place] speaker phone. Mother states that she breastfed her first child for 18 months . That child is 42 yrs old. Mother has been bottle feeding with formula and breastfeeding. Mother concerned that her nipple is too large for infants mouth.  Mother assist with latching infant on in football and cross cradle hold. Infant sustained latch for 15-20 mins on each breast. Infant can open her mouth very wide and had good depth. Observed audible swallows. Mother has a good flow of colostrum that sprays when hand expressed. Discussed the use of formula and advised mother to give her own milk in a bottle if desired. Encouraged mother to breastfeed infant with feeding cues . Mother was given a hand pump with a #27 flange. Mother can read some english. I reviewed proper latch and breast milk storage guidelines in the Baby and Me book. Lactation brochure given with basic teaching done. Mother receptive to all teaching.   Patient Name: Holly Chan [id number] Date: 03/28/2015 Reason for consult: Initial assessment   Maternal Data Has patient been taught Hand Expression?: Yes Does the patient have breastfeeding experience prior to this delivery?: Yes  Feeding Feeding Type: Breast Fed Length of feed: 15 min  LATCH Score/Interventions Latch: Grasps breast easily, tongue down, lips flanged, rhythmical sucking. Intervention(s): Adjust position  Audible Swallowing: Spontaneous and intermittent  Type of Nipple: Everted at rest and after stimulation  Comfort (Breast/Nipple): Soft / non-tender     Hold (Positioning): Assistance needed to correctly position infant at breast and maintain latch. Intervention(s): Breastfeeding basics reviewed;Support Pillows;Position options;Skin to skin  LATCH Score: 9  Lactation Tools Discussed/Used     Consult Status Consult Status: Follow-up Date:  03/28/15 Follow-up type: In-patient    Stevan Born [facility] 03/28/2015, 5:19 PM

## 2015-03-28 NOTE — Anesthesia Postprocedure Evaluation (Signed)
Anesthesia Post Note  Patient: Holly Chan  Procedure(s) Performed: Procedure(s) (LRB): CESAREAN SECTION (N/A)  Anesthesia type: Epidural  Patient location: Mother/Baby  Post pain: Pain level controlled  Post assessment: Post-op Vital signs reviewed  Last Vitals:  Filed Vitals:   03/28/15 0621  BP: 108/58  Pulse: 70  Temp: 36.8 C  Resp: 16    Post vital signs: Reviewed  Level of consciousness: awake  Complications: No apparent anesthesia complications

## 2015-03-28 NOTE — Progress Notes (Signed)
Pt. Not ambulated due to nausea and tiredness. Resting well after Phenergan. O2 @ 2liters/min. Due to desats in 80s while resting. Will allow to rest until morning, and attempt p.o. Fluids and ambulation again.

## 2015-03-28 NOTE — Progress Notes (Signed)
POD#1  Subjective:  Holly Chan is a 44 y.o. [id number] [redacted]w[redacted]d s/p pLTCS for fetal intolerance of labor.   Ms. Rail had two episodes of vomiting overnight. She also has not yet gotten out of bed. She was given toradol overnight, which she says has alleviated her pain.  She has not had flatus. She has not had bowel movement.  Lochia Moderate.  Plan for birth control is IUD.  Method of Feeding: Breast and Bottle.  Objective: BP 107/76 mmHg  Pulse 76  Temp(Src) 98 F (36.7 C) (Oral)  Resp 18  Ht  (1.626 m)  Wt 215 lb (97.523 kg)  BMI 36.89 kg/m2  SpO2 98%  LMP 06/26/2014 (Exact Date)  Breastfeeding? Unknown  Physical Exam:  General: alert, cooperative and no distress Lochia:normal flow Chest: non-labored breathing Heart: RRR no murmurs appreciated Abdomen: soft, nontender, fundus firm at/below umbilicus Uterine Fundus: firm DVT Evaluation: No evidence of DVT seen on physical exam.    Recent Labs  03/26/15 1020 03/28/15 0540  HGB 9.4* 8.9*  HCT 28.0* 27.5*    Assessment/Plan:  ASSESSMENT: Holly Chan is a 44 y.o. [id number] [redacted]w[redacted]d POD#1 s/p pLTCS doing well.   Plan for discharge tomorrow   LOS: 2 days   Tarri Abernethy 03/28/2015, 5:49 AM  i examined pt and agree with assessment

## 2015-03-29 ENCOUNTER — Telehealth (HOSPITAL_COMMUNITY): Payer: Self-pay | Admitting: *Deleted

## 2015-03-29 ENCOUNTER — Encounter (HOSPITAL_COMMUNITY): Payer: Self-pay | Admitting: Obstetrics and Gynecology

## 2015-03-29 MED ORDER — OXYCODONE-ACETAMINOPHEN 5-325 MG PO TABS: 1.0000 | ORAL_TABLET | ORAL | Status: DC | PRN

## 2015-03-29 MED ORDER — IBUPROFEN 600 MG PO TABS: 600.0000 mg | ORAL_TABLET | Freq: Four times a day (QID) | ORAL | Status: DC

## 2015-03-29 MED ORDER — DOCUSATE SODIUM 100 MG PO CAPS: 100.0000 mg | ORAL_CAPSULE | Freq: Two times a day (BID) | ORAL | Status: DC

## 2015-03-29 NOTE — Telephone Encounter (Signed)
-----   Message from Pennie Banter sent at 03/29/2015  9:30 AM EDT ----- Regarding: FW: primary c/s for NRFHt, suspected CPD   ----- Message -----    From: Tilda Burrow, MD    Sent: 03/27/2015   5:54 PM      To: Tilda Burrow, MD, Pennie Banter Subject: primary c/s for NRFHt, suspected CPD

## 2015-03-29 NOTE — Clinical Social Work Maternal (Signed)
CLINICAL SOCIAL WORK MATERNAL/CHILD NOTE  Patient Details  Name: Holly Chan MRN: [id number] Date of Birth: December 24, 1970  Date:  03/29/2015  Clinical Social Worker Initiating Note:  Lucita Ferrara, LCSW Date/ Time Initiated:  03/29/15/0930     Child's Name:  Freddi Starr   Legal Guardian:  Hipolito Bayley (mother) and Waldron Session (father)  Need for Interpreter:  [place]   Date of Referral:  03/27/15     Reason for Referral:  Late or No Prenatal Care    Referral Source:  [facility]   Address:  [place], [place] [address]  Phone number:  [phone]   Household Members:  MOB's sister, sister's children  Natural Supports (not living in the home):  Spouse/significant other   Professional Supports: None   Employment: At home/Student   Type of Work: MOB will begin English classes at [employer].   Education:    N/A  Museum/gallery curator Resources:  Medicaid   Other Resources:  [facility]   Cultural/Religious Considerations Which May Impact Care:  MOB recently moved to the [place] from [place].  Strengths:  Ability to meet basic needs    Risk Factors/Current Problems:   1) Recently moved to [place], has limited family support in [place].  MOB received late prenatal care due to move and relocation.   Cognitive State:  Able to Concentrate , Alert , Goal Oriented , Linear Thinking , Insightful    Mood/Affect:  Happy , Animated, Calm    CSW Assessment:  CSW received request for consult due to MOB presenting late to prenatal care at 34 weeks.  CSW reviewed chart, and noted that MOB is from [place], and arrived in the [place] during this pregnancy.  CSW notes that interpreter is required for communication; however, MOB has signed necessary paperwork to provide consent for her sister to interpret.  MOB and sister presented as easily engaged and receptive to the visit.  MOB was observed to be caring for and attending to the infant during the assessment.  MOB was noted to  be in a pleasant mood and displayed a full range in affect.  MOB and her sister asked numerous questions related to Medicaid.  Per MOB's sister, they applied for Medicaid approximately one month ago, but have not yet received notification about the status of their application.  CSW validated feelings of frustration, but provided education on length of time to be approved for Medicaid.  CSW encouraged MOB to contact her Medicaid worker, family acknowledged the recommendation.  MOB shared that she will be contacting [facility] to make appointment this morning.   MOB has obtained a car seat from the [employer] program at [place] discussed programs and resources available through the [facility], and MOB and her sister denied previous knowledge related to this resource.  CSW provided information on how to contact their programming, which also assists with ensuring that health care needs are met.   MOB expressed feelings of satisfaction with her experiences of giving birth. She stated that her first child was born in [place], and that this experience is "much better".  MOB denied any concerns or notable feelings secondary to giving birth without the presence of the FOB (who continues to live in [place]). She stated that she has been able to talk to him and send him pictures. MOB shared that she has plans to continue to live in [place] for the forsesable future, and stated that the FOB does not have any  concrete plans to move to the [place] at this time.  MOB reported feelings of satisfaction with her level of support that she receives from her sister.   CSW provided education on the hospital drug screen policy. MOB acknowledged that the infant will be drug screened due to late prenatal care. MOB denied any substance use during the pregnancy.  CSW Plan/Description:   1)Patient/Family Education: Hospital drug screen policy 2) Information/Referrals: [facility]  3) CSW to monitor infant's drugs screens, and will follow up if needed. 4)No Further Intervention Required/No Barriers to Discharge    Sharyl Nimrod 03/29/2015, 10:29 AM

## 2015-03-29 NOTE — Discharge Summary (Signed)
Obstetric Discharge Summary Reason for Admission: induction of labor for AMA Prenatal Procedures: NST Intrapartum Procedures: cesarean: low cervical, transverse Postpartum Procedures: none Complications-Operative and Postpartum: none HEMOGLOBIN  Date Value Ref Range Status  03/28/2015 8.9* 12.0 - 15.0 g/dL Final   HCT  Date Value Ref Range Status  03/28/2015 27.5* 36.0 - 46.0 % Final   Hospital Course Patient was admitted for IOL 2/2 to AMA. Progressed well but then had recurrent late decelerations with contractions and arrest of dilation at 5.5cm.  She was taken for pLTCS which was uneventful. On the day of discharge she was ambulating, tolerating PO and passing flatus.   Physical Exam:  General: alert, cooperative and appears stated age 44: appropriate Uterine Fundus: firm Incision: healing well, no significant drainage, no dehiscence, no significant erythema DVT Evaluation: No evidence of DVT seen on physical exam.  Discharge Diagnoses: Term Pregnancy-delivered  Discharge Information: Date: 03/29/2015 Activity: unrestricted and pelvic rest Diet: routine Medications: Ibuprofen, Colace and Percocet Condition: stable Instructions: refer to practice specific booklet Discharge to: home   Newborn Data: Live born female  Birth Weight: 7 lb 2 oz (3232 g) APGAR: 8, 9  Home with mother.  Isa Rankin [facility] 03/29/2015, 10:17 AM

## 2015-03-30 LAB — TYPE AND SCREEN
ABO/RH(D): O POS
Antibody Screen: NEGATIVE
UNIT DIVISION: 0
Unit division: 0
Unit division: 0

## 2015-04-01 NOTE — Telephone Encounter (Signed)
Erroneous encounter

## 2015-05-03 ENCOUNTER — Ambulatory Visit (INDEPENDENT_AMBULATORY_CARE_PROVIDER_SITE_OTHER): Payer: Self-pay | Admitting: Internal Medicine

## 2015-05-03 ENCOUNTER — Encounter: Payer: Self-pay | Admitting: Internal Medicine

## 2015-05-03 VITALS — BP 144/91 | HR 77 | Wt 191.0 lb

## 2015-05-03 DIAGNOSIS — G8929 Other chronic pain: Secondary | ICD-10-CM | POA: Insufficient documentation

## 2015-05-03 DIAGNOSIS — M549 Dorsalgia, unspecified: Secondary | ICD-10-CM

## 2015-05-03 DIAGNOSIS — D649 Anemia, unspecified: Secondary | ICD-10-CM

## 2015-05-03 DIAGNOSIS — M545 Low back pain, unspecified: Secondary | ICD-10-CM

## 2015-05-03 MED ORDER — PRENATAL MULTIVITAMIN CH: 1.0000 | ORAL_TABLET | Freq: Every day | ORAL | Status: AC

## 2015-05-03 MED ORDER — ACETAMINOPHEN 325 MG PO TABS: 650.0000 mg | ORAL_TABLET | ORAL | Status: DC | PRN

## 2015-05-03 MED ORDER — MELOXICAM 7.5 MG PO TABS: 7.5000 mg | ORAL_TABLET | Freq: Every day | ORAL | Status: DC

## 2015-05-03 NOTE — Assessment & Plan Note (Signed)
The differential includes spinal stenosis, osteoarthritis, degenerative disc disease. Radiculopathy is possible, but her only neurological finding is minimally diminished sensation on the left lower extremity. A serious, although unlikely, possibility is metastatic disease. No alarm symptoms (bowel or bladder incontinence). The precise etiology will at least require X-rays of her lumbar spine, but we will treat symptomatically for now given that she is awaiting an Orange Card to assist with cost issues. - Meloxicam 7.5 mg daily. While there is a possible lactation risk, it is not definitively known if Meloxicam is secreted in breast milk. Given the functional detriment caused by her back pain, I feel the benefits of meloxicam outweigh the risks at this time - Acetaminophen 650 mg q4h prn - Advised to use heating pads - Given instruction in [place] on specific exercises she could perform at home to reduce back pain. Given her recent C-section, I specifically marked out exercises that significantly increase intra-abdominal pressure  (e.g., full sit-ups), and the patient expressed understanding. - Follow-up in 1 week

## 2015-05-03 NOTE — Assessment & Plan Note (Signed)
Lowest Hgb on our record is 8.9, most likely related to increased blood volume and/or iron deficiency. She denies any shortness of breath, weakness, light-headedness, or bleeding presently. - Check CBC today for improvement - Anemia panel at next visit if anemia has not improved

## 2015-05-03 NOTE — Progress Notes (Signed)
   Subjective:    Patient ID: Holly Chan, female    DOB: 1970-09-21, 44 y.o.   MRN: [id number]  HPI  Holly Chan is a 44 year woman with a PMH of anemia, chronic lower back pain who comes to the clinic to address her lower back pain. She is French-speaking and history was obtained via a Nurse, children's. The pain has been going on for approximately a year and has been gradually worsening. She describes it as primarily in the center of her back on her spine, with some pain on the sides. She had previously taken ibuprofen which was minimally helpful. She was also given oxycodone for pain management s/p C-section, which also helped. She cannot identify any injury or event that caused her back pain, and she has not had any pain like this before. The pain does not radiate down her legs and is not associated with any weakness or parasthesias in her legs. She denies bowel and bladder incontinence.  She is an immigrant from [place] and moved to [place] 3 months ago from the [place], [place]. She gave birth in April via a C-section. She was noted to be anemic during the hospitalization for her pregnancy, with Hgb ranging from 8.9 to 9.4. She denies any shortness of breath, weakness, light-headedness, or blood loss today.  Review of Systems  Constitutional: Negative for fever, fatigue and unexpected weight change.  HENT: Negative for sore throat.   Respiratory: Negative for cough and shortness of breath.   Cardiovascular: Negative for chest pain and leg swelling.  Gastrointestinal: Positive for abdominal pain. Negative for nausea, vomiting, diarrhea, constipation and blood in stool.       Abdominal pain from C-section.  Endocrine: Negative for cold intolerance and polyuria.  Genitourinary: Negative for urgency, hematuria and menstrual problem.  Musculoskeletal: Positive for back pain. Negative for myalgias and arthralgias.  Skin: Negative for rash.  Neurological: Negative for dizziness, syncope, weakness  and light-headedness.  Psychiatric/Behavioral: Negative for dysphoric mood.       Objective:   Physical Exam  Constitutional: She appears well-developed. No distress.  HENT:  Mouth/Throat: Oropharynx is clear and moist. No oropharyngeal exudate.  Eyes: EOM are normal. Pupils are equal, round, and reactive to light.  Cardiovascular: Normal rate, regular rhythm and normal heart sounds.   Pulmonary/Chest: Effort normal and breath sounds normal. No respiratory distress.  Abdominal: Soft. Bowel sounds are normal. She exhibits no distension. There is tenderness. There is no rebound and no guarding.  Tenderness to palpation over C-section site. No discharge or swelling.  Musculoskeletal: She exhibits tenderness.  Tenderness to palpation of back starting at lower thoracic and most tender over lumbar vertebrae. Tenderness to palpation of paraspinal muscles. Lasegue sign negative.  Lymphadenopathy:    She has no cervical adenopathy.  Neurological: She is alert. She displays normal reflexes.  Slightly diminished sensation for light touch in left, lower, extremity, not clearly in a dermatomal pattern. Proprioception in tact.  Skin: No rash noted.  Psychiatric: She has a normal mood and affect.  Vitals reviewed.         Assessment & Plan:  Please see problem based assessment and plan for details.

## 2015-05-03 NOTE — Patient Instructions (Signed)
Ms. Civello, it was a pleasure meeting you today. For your back pain, we are going to start a new drug called Meloxicam, which you will take once a day. Potential side effects include abdominal pain, heart burn, and darkening stools. If you start to get any of these, please stop taking Meloxicam. You can also use Tylenol every 4 hours as needed for pain. If you're not in pain, do not take the Tylenol. You can also use a heating pad on your back. We would like to see you again in 1 to 2 weeks to see how your back pain is doing.

## 2015-05-04 LAB — CBC
HEMATOCRIT: 36.7 % (ref 34.0–46.6)
HEMOGLOBIN: 11.5 g/dL (ref 11.1–15.9)
MCH: 26.6 pg (ref 26.6–33.0)
MCHC: 31.3 g/dL — AB (ref 31.5–35.7)
MCV: 85 fL (ref 79–97)
PLATELETS: 343 10*3/uL (ref 150–379)
RBC: 4.32 x10E6/uL (ref 3.77–5.28)
RDW: 14.6 % (ref 12.3–15.4)
WBC: 6.7 10*3/uL (ref 3.4–10.8)

## 2015-05-04 LAB — BMP8+ANION GAP
Anion Gap: 15 mmol/L (ref 10.0–18.0)
BUN / CREAT RATIO: 20 (ref 9–23)
BUN: 16 mg/dL (ref 6–24)
CO2: 25 mmol/L (ref 18–29)
CREATININE: 0.82 mg/dL (ref 0.57–1.00)
Calcium: 9.5 mg/dL (ref 8.7–10.2)
Chloride: 98 mmol/L (ref 97–108)
GFR, EST AFRICAN AMERICAN: 102 mL/min/{1.73_m2} (ref >59)
GFR, EST NON AFRICAN AMERICAN: 89 mL/min/{1.73_m2} (ref >59)
Glucose: 82 mg/dL (ref 65–99)
Potassium: 4.3 mmol/L (ref 3.5–5.2)
SODIUM: 138 mmol/L (ref 134–144)

## 2015-05-04 NOTE — Progress Notes (Signed)
Internal Medicine Clinic Attending  I saw and evaluated the patient.  I personally confirmed the key portions of the history and exam documented by Dr. Ford and I reviewed pertinent patient test results.  The assessment, diagnosis, and plan were formulated together and I agree with the documentation in the resident's note. 

## 2015-05-10 ENCOUNTER — Ambulatory Visit: Payer: Self-pay

## 2015-05-10 ENCOUNTER — Encounter: Payer: Self-pay | Admitting: Internal Medicine

## 2015-05-10 ENCOUNTER — Ambulatory Visit (INDEPENDENT_AMBULATORY_CARE_PROVIDER_SITE_OTHER): Payer: Self-pay | Admitting: Internal Medicine

## 2015-05-10 VITALS — BP 132/83 | HR 89 | Temp 98.9°F | Wt 192.3 lb

## 2015-05-10 DIAGNOSIS — G8929 Other chronic pain: Secondary | ICD-10-CM

## 2015-05-10 DIAGNOSIS — M545 Low back pain: Secondary | ICD-10-CM

## 2015-05-10 MED ORDER — TRAMADOL HCL 50 MG PO TABS: 50.0000 mg | ORAL_TABLET | Freq: Four times a day (QID) | ORAL | Status: DC | PRN

## 2015-05-10 NOTE — Patient Instructions (Addendum)
Ms. Holly Chan it was nice meeting you today.  -Please take Tramadol 50 mg: 1 tablet by mouth every 6 hours as needed for back pain.  -DO NOT breastfeed while you are taking this medication.   -Please let us know when you get your orange card so that we can send you for your x-ray

## 2015-05-11 ENCOUNTER — Ambulatory Visit: Payer: Self-pay

## 2015-05-13 NOTE — Progress Notes (Signed)
Patient ID: Holly Chan, female   DOB: [birth year], 44 y.o.   MRN: [id number]   Subjective:   Patient ID: Holly Chan female   DOB: 12/13/1970 44 y.o.   MRN: [id number]  HPI: [name] is a 43 y.o. female with a past medical history of anemia presenting to the clinic for follow-up of her chronic lower back pain. Please see assessment and plan for the status of the patient's chronic medical conditions.    Past Medical History  Diagnosis Date  . AMA (advanced maternal age) multigravida 35+   . Anemia   . Obesity   . Vaginal Pap smear, abnormal    Current Outpatient Prescriptions  Medication Sig Dispense Refill  . acetaminophen (TYLENOL) 325 MG tablet Take 2 tablets (650 mg total) by mouth every 4 (four) hours as needed. 100 tablet 2  . Prenatal Vit-Fe Fumarate-FA (PRENATAL MULTIVITAMIN) TABS tablet Take 1 tablet by mouth daily at 12 noon. 100 tablet 2  . traMADol (ULTRAM) 50 MG tablet Take 1 tablet (50 mg total) by mouth every 6 (six) hours as needed for moderate pain or severe pain. 40 tablet 0   No current facility-administered medications for this visit.   Family History  Problem Relation Age of Onset  . Hypertension Mother   . Cancer Mother     breast  . Hypertension Father    Social History   Social History  . Marital Status: Married    Spouse Name: N/A  . Number of Children: N/A  . Years of Education: N/A   Social History Main Topics  . Smoking status: Never Smoker   . Smokeless tobacco: Never Used  . Alcohol Use: No  . Drug Use: No  . Sexual Activity: Yes    Birth Control/ Protection: None   Other Topics Concern  . None   Social History Narrative   Review of Systems: Review of Systems  Constitutional: Negative for fever and chills.  HENT: Negative for ear pain.   Eyes: Negative for blurred vision and pain.  Respiratory: Negative for cough, shortness of breath and wheezing.   Cardiovascular: Negative for chest pain and leg swelling.    Gastrointestinal: Negative for nausea, vomiting and abdominal pain.  Genitourinary: Negative for dysuria, urgency and frequency.  Musculoskeletal: Positive for back pain. Negative for myalgias.  Skin: Negative for itching and rash.  Neurological: Negative for dizziness, sensory change, focal weakness and headaches.   Objective:  Physical Exam: Filed Vitals:   05/10/15 1408  BP: 132/83  Pulse: 89  Temp: 98.9 F (37.2 C)  TempSrc: Oral  Weight: 192 lb 4.8 oz (87.227 kg)  SpO2: 98%   Physical Exam  Constitutional: She is oriented to person, place, and time. She appears well-developed and well-nourished. No distress.  HENT:  Head: Normocephalic and atraumatic.  Eyes: EOM are normal. Pupils are equal, round, and reactive to light.  Neck: Normal range of motion. Neck supple. No tracheal deviation present.  Cardiovascular: Normal rate, regular rhythm and intact distal pulses.   Pulmonary/Chest: Effort normal. No respiratory distress. She has no wheezes. She has no rales.  Abdominal: Soft. Bowel sounds are normal. She exhibits no distension. There is no tenderness.  Musculoskeletal: Normal range of motion. She exhibits tenderness. She exhibits no edema.  Paraspinal muscle tenderness in the lumbar and sacral areas. Straight leg raise test negative. Normal range of motion of spine.  Neurological: She is alert and oriented to person, place, and time.  Strength and sensation grossly intact in bilateral  lower extremities.  Skin: Skin is warm and dry.   Assessment & Plan:

## 2015-05-13 NOTE — Assessment & Plan Note (Addendum)
Patient continues to complain of chronic lower back pain (greater than 1 year). States she delivered a baby in September but her back pain has always been there. Denies any injury to the back. States over the past 1 year the pain has been getting progressively worse making it difficult for her to sit longer than 30 minutes at a time and lie down at night. Denies any radiation of the pain down her legs. States at night she feels something moving on her spine. Patient denies any fevers, night sweats, or weight loss. Denies history of TB exposure. During her previous visit she was given a trial of meloxicam but states it has not helped her much. As per patient, [place] doctor at sent to physical therapy to help with the back pain and she did attend several sessions but to no avail. Physical examination revealed normal range of motion, however, patient exhibited paraspinal muscle tenderness in the lumbar and sacral areas. Straight leg raise test negative. Strength and sensation grossly intact in bilateral lower extremities.   -Patient is in the process of obtaining an orange card. Once she has the card, the plan is to order imaging of her back.  -Meloxicam has been discontinued. Instead, patient has been started on tramadol 50 mg every 6 hours when necessary. I explained to her that tramadol has a high rate of excretion through breast milk, however, patient stated she is in a lot of pain and is willing to stop breast-feeding in order to relieve her back pain. Again, explained to her several times that while she is on tramadol she cannot breast-feed at all. Patient agreed and expressed her understanding.

## 2015-05-17 NOTE — Progress Notes (Signed)
I saw and evaluated the patient.  I personally confirmed the key portions of Dr. Rathore's history and exam and reviewed pertinent patient test results.  The assessment, diagnosis, and plan were formulated together and I agree with the documentation in the resident's note. 

## 2015-05-19 ENCOUNTER — Encounter: Payer: Self-pay | Admitting: Internal Medicine

## 2015-05-19 ENCOUNTER — Ambulatory Visit (INDEPENDENT_AMBULATORY_CARE_PROVIDER_SITE_OTHER): Payer: Self-pay | Admitting: Internal Medicine

## 2015-05-19 VITALS — BP 122/76 | HR 74 | Temp 98.7°F | Wt 188.8 lb

## 2015-05-19 DIAGNOSIS — G8929 Other chronic pain: Secondary | ICD-10-CM

## 2015-05-19 DIAGNOSIS — M545 Low back pain, unspecified: Secondary | ICD-10-CM

## 2015-05-19 MED ORDER — MELOXICAM 7.5 MG PO TABS: 7.5000 mg | ORAL_TABLET | Freq: Every day | ORAL | Status: DC

## 2015-05-19 MED ORDER — TRAMADOL HCL 50 MG PO TABS: 50.0000 mg | ORAL_TABLET | Freq: Four times a day (QID) | ORAL | Status: DC | PRN

## 2015-05-19 NOTE — Patient Instructions (Addendum)
Ms. Holly Chan it was nice seeing you today.  -Continue taking Tramadol 50 mg: 1 tablet by mouth every 6 hours as needed for back pain.  -Also take Meloxicam 7.5 mg: 1 tablet by mouth everyday.  -DO NOT breastfeed while you are taking these medications.

## 2015-05-23 NOTE — Progress Notes (Signed)
Medicine attending: I personally interviewed and briefly examined this patient, and reviewed pertinent clinical laboratory and radiographic data  with resident physician Dr.Vasu Rathore and we discussed a  management plan.

## 2015-05-23 NOTE — Assessment & Plan Note (Addendum)
Patient still continues to complain of chronic lower back pain (please see previous note for details). Denies any radiation of the pain down her legs. Denies any alarm symptoms such as urinary or fecal incontinence. During her previous visit patient was started on tramadol 50 mg every 6 when necessary. States the medication has helped but she still continues to have pain. Physical examination showing pain on palpation of the lumbar spine and paraspinal muscle tenderness in the area. In addition patient reported having pain upon flexion to the left side. Strength and sensation grossly intact in bilateral lower extremities. We didn't have any imaging of her spine on file because patient did not have an orange card. [facility] and is able to go for imaging. -MRI of lumbar spine -Continue tramadol -Restarted meloxicam 7.5 mg daily -Again, I explained to the patient that both tramadol and meloxicam are excreted through breast milk and that she cannot breast-feed at all while she is taking these medications. Patient agreed and expressed her understanding.

## 2015-05-23 NOTE — Progress Notes (Addendum)
Patient ID: Holly Chan, female   DOB: April 11, 1971, 44 y.o.   MRN: [id number]   Subjective:   Patient ID: Holly Chan female   DOB: [birth year] 44 y.o.   MRN: [id number]  HPI: [name] is a 44 y.o. F with a PMHx of conditions listed below here for a follow-up of her chronic back pain. Please see assessment and plan for the status of the patient's chronic medical conditions.    Addendum: Patient speaks [place] and interpreter services used. Interpreter ID [id number].    Past Medical History  Diagnosis Date  . AMA (advanced maternal age) multigravida 35+   . Anemia   . Obesity   . Vaginal Pap smear, abnormal    Current Outpatient Prescriptions  Medication Sig Dispense Refill  . meloxicam (MOBIC) 7.5 MG tablet Take 1 tablet (7.5 mg total) by mouth daily. 30 tablet 0  . Prenatal Vit-Fe Fumarate-FA (PRENATAL MULTIVITAMIN) TABS tablet Take 1 tablet by mouth daily at 12 noon. 100 tablet 2  . traMADol (ULTRAM) 50 MG tablet Take 1 tablet (50 mg total) by mouth every 6 (six) hours as needed for moderate pain or severe pain. 40 tablet 0   No current facility-administered medications for this visit.   Family History  Problem Relation Age of Onset  . Hypertension Mother   . Cancer Mother     breast  . Hypertension Father    Social History   Social History  . Marital Status: Married    Spouse Name: N/A  . Number of Children: N/A  . Years of Education: N/A   Social History Main Topics  . Smoking status: Never Smoker   . Smokeless tobacco: Never Used  . Alcohol Use: No  . Drug Use: No  . Sexual Activity: Yes    Birth Control/ Protection: None   Other Topics Concern  . None   Social History Narrative   Review of Systems: Review of Systems  Constitutional: Negative for fever and chills.  HENT: Negative for ear pain.   Eyes: Negative for blurred vision and pain.  Respiratory: Negative for cough, shortness of breath and wheezing.   Cardiovascular: Negative for chest pain and  leg swelling.  Gastrointestinal: Negative for nausea, vomiting and abdominal pain.  Genitourinary: Negative for dysuria, urgency and frequency.  Musculoskeletal: Positive for back pain.  Skin: Negative for itching and rash.  Neurological: Negative for sensory change, focal weakness and headaches.   Objective:  Physical Exam: Filed Vitals:   05/19/15 1606  BP: 122/76  Pulse: 74  Temp: 98.7 F (37.1 C)  TempSrc: Oral  Weight: 188 lb 12.8 oz (85.639 kg)  SpO2: 100%   Physical Exam  Constitutional: She is oriented to person, place, and time. She appears well-developed and well-nourished. No distress.  HENT:  Head: Normocephalic and atraumatic.  Eyes: EOM are normal. Pupils are equal, round, and reactive to light.  Neck: Neck supple. No tracheal deviation present.  Cardiovascular: Normal rate, regular rhythm and intact distal pulses.   Pulmonary/Chest: Effort normal. No respiratory distress. She has no wheezes. She has no rales.  Abdominal: Soft. Bowel sounds are normal. She exhibits no distension. There is no tenderness.  Musculoskeletal: She exhibits tenderness. She exhibits no edema.  Lumbar area: pain on palpation of spine and paraspinal muscles. Pain on flexion to the left side. Straight leg raise test negative bilaterally. Strength and sensation grossly intact in bilateral lower extremities.  Neurological: She is alert and oriented to person, place, and time.  Skin: Skin  is warm and dry.   Assessment & Plan:

## 2015-06-08 ENCOUNTER — Ambulatory Visit (HOSPITAL_COMMUNITY): Admission: RE | Admit: 2015-06-08 | Payer: Self-pay | Source: Ambulatory Visit

## 2015-06-09 ENCOUNTER — Encounter: Payer: Self-pay | Admitting: Internal Medicine

## 2015-06-10 ENCOUNTER — Ambulatory Visit (HOSPITAL_COMMUNITY)
Admission: RE | Admit: 2015-06-10 | Discharge: 2015-06-10 | Disposition: A | Discharge status: Home / Self Care | Payer: Self-pay | Source: Ambulatory Visit | Attending: Oncology | Admitting: Oncology

## 2015-06-10 DIAGNOSIS — M4806 Spinal stenosis, lumbar region: Secondary | ICD-10-CM | POA: Insufficient documentation

## 2015-06-10 DIAGNOSIS — M545 Low back pain, unspecified: Secondary | ICD-10-CM

## 2015-06-10 DIAGNOSIS — M5126 Other intervertebral disc displacement, lumbar region: Secondary | ICD-10-CM | POA: Insufficient documentation

## 2015-06-10 DIAGNOSIS — G8929 Other chronic pain: Secondary | ICD-10-CM | POA: Insufficient documentation

## 2015-06-14 ENCOUNTER — Ambulatory Visit (INDEPENDENT_AMBULATORY_CARE_PROVIDER_SITE_OTHER): Payer: Self-pay | Admitting: Internal Medicine

## 2015-06-14 ENCOUNTER — Encounter: Payer: Self-pay | Admitting: Internal Medicine

## 2015-06-14 VITALS — BP 135/89 | HR 92 | Temp 98.2°F | Wt 193.4 lb

## 2015-06-14 DIAGNOSIS — G8929 Other chronic pain: Secondary | ICD-10-CM

## 2015-06-14 DIAGNOSIS — M545 Low back pain: Secondary | ICD-10-CM

## 2015-06-14 MED ORDER — MELOXICAM 7.5 MG PO TABS: 7.5000 mg | ORAL_TABLET | Freq: Every day | ORAL | Status: AC

## 2015-06-14 MED ORDER — TRAMADOL HCL 50 MG PO TABS: 50.0000 mg | ORAL_TABLET | Freq: Four times a day (QID) | ORAL | Status: AC | PRN

## 2015-06-14 NOTE — Progress Notes (Signed)
   Patient ID: Holly Chan female   DOB: Dec 27, 1970 44 y.o.   MRN: [id number]  Subjective:   HPI: [name] is a 44 y.o. Holly Chan speaking lady from [place] with PMH of chronic low back pain who presents to [facility] today for follow-up of LBP and MRI results. She is accompanied by her sister, who translates as an interpreter was requested but was unavailable.  Please see problem-based charting for status of medical issues pertinent to this visit.  Review of Systems: Pertinent items noted in HPI and remainder of comprehensive ROS otherwise negative.  Objective:  Physical Exam: Filed Vitals:   06/14/15 0900  BP: 135/89  Pulse: 92  Temp: 98.2 F (36.8 C)  TempSrc: Oral  Weight: 193 lb 6.4 oz (87.726 kg)  SpO2: 99%   Gen: Well-appearing, alert and oriented to person, place, and time HEENT: Oropharynx clear without erythema or exudate.  Neck: No cervical LAD, no thyromegaly or nodules, no JVD noted. CV: Normal rate, regular rhythm, no murmurs, rubs, or gallops Pulmonary: Normal effort, CTA bilaterally, no wheezing, rales, or rhonchi Abdominal: Soft, non-tender, non-distended, without rebound, guarding, or masses Extremities: Distal pulses 2+ in upper and lower extremities bilaterally, no tenderness, erythema or edema. MSK: Pain with palpation of lumbar paraspinal muscles without spinous process tenderness. Intact ROM. Straight leg test negative bilaterally. Normal strength and sensation in lower extremities bilaterally. Skin: No atypical appearing moles. No rashes  Assessment & Plan:  Please see problem-based charting for assessment and plan.  Reubin MilanBilly Skeeter Sheard, MD Resident Physician, PGY-1 Department of Internal Medicine [facility]

## 2015-06-14 NOTE — Assessment & Plan Note (Addendum)
Patient continues to complain of chronic lower back pain that is unchanged in nature from previous visits. She still denies any red flag symptoms including fever, point vertebral tenderness, urinary of fecal incontinence, or saddle anesthesia. She says her pain is moderately controlled on tramadol 50mg  q6 and daily meloxicam. She does get good relief with medication but the symptoms seem to come back before her next dose. MRI was obtained last week which showed mild spinal stenosis at L2-3, L3-4, L4-5 as well as L foraminal disc protrusion at L4-5 and moderate foraminal encroachment bilaterally at L5-S1 due to facet overgrowth. We discussed these results extensively and further management medically. -Continue tramadol and meloxicam as scheduled - also continued to counsel extensively about not breastfeeding while not taking these medicines -Referred to sports medicine  -Referred to physical therapy -We will try to maximize non-surgical management with sports medicine as well as a trial of PT. If these measures do not improve symptoms, will consider referral for surgical eval -Follow-up in 2 months

## 2015-06-23 ENCOUNTER — Ambulatory Visit: Payer: Self-pay | Admitting: Physical Therapy

## 2015-06-27 NOTE — Progress Notes (Signed)
Internal Medicine Clinic Attending  I saw and evaluated the patient.  I personally confirmed the key portions of the history and exam documented by Dr. Kennedy and I reviewed pertinent patient test results.  The assessment, diagnosis, and plan were formulated together and I agree with the documentation in the resident's note.  

## 2015-06-28 ENCOUNTER — Encounter: Payer: Self-pay | Admitting: *Deleted

## 2015-07-27 NOTE — Addendum Note (Signed)
Addended by: Neomia DearPOWERS, Prince Olivier E on: 07/27/2015 06:10 PM   Modules accepted: Orders

## 2015-07-27 NOTE — Addendum Note (Signed)
Addended by: Neomia DearPOWERS, Ebb Carelock E on: 07/27/2015 06:14 PM   Modules accepted: Orders

## 2017-05-04 IMAGING — MR MR LUMBAR SPINE W/O CM
4 of 5 series · 19 of 48 positions shown · non-contrast
Comparison: None.

CLINICAL DATA: Chronic low back pain

EXAM:
MRI LUMBAR SPINE WITHOUT CONTRAST
TECHNIQUE: Multiplanar, multisequence MR imaging of the lumbar spine was
performed. No intravenous contrast was administered.

[Series 3: T1 · sagittal · 4.0mm · 0.55mm/px · 3 of 12 slices shown (1 of 2)]
[im 1/12]
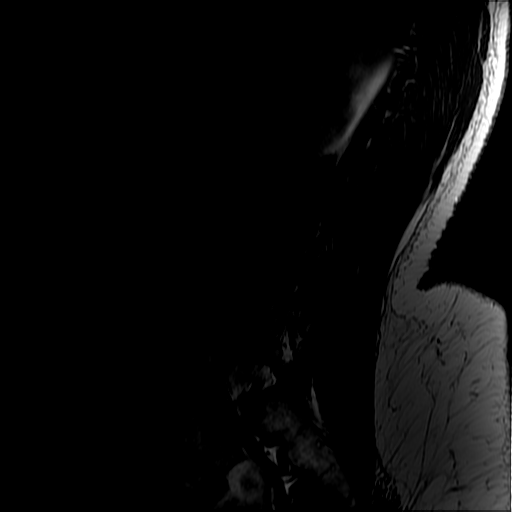
[im 8/12]
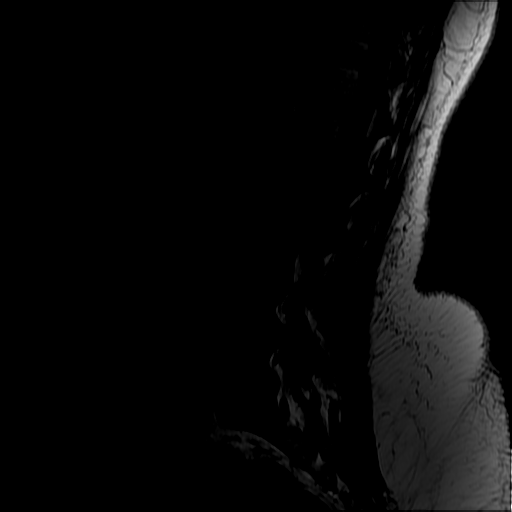
[im 12/12]
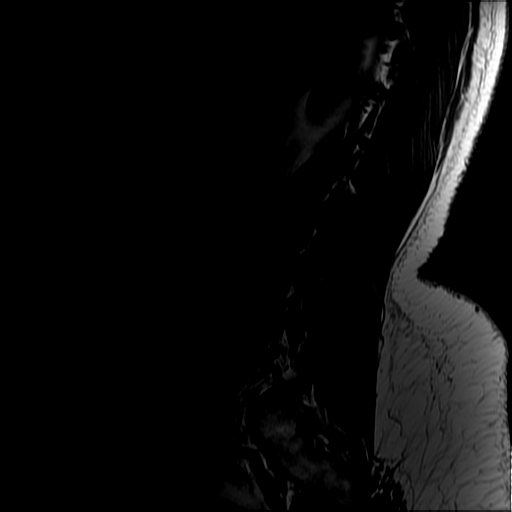

[Series 4: T2 post-contrast · sagittal · 4.0mm · 0.55mm/px · 5 of 12 slices shown]
[im 1/12]
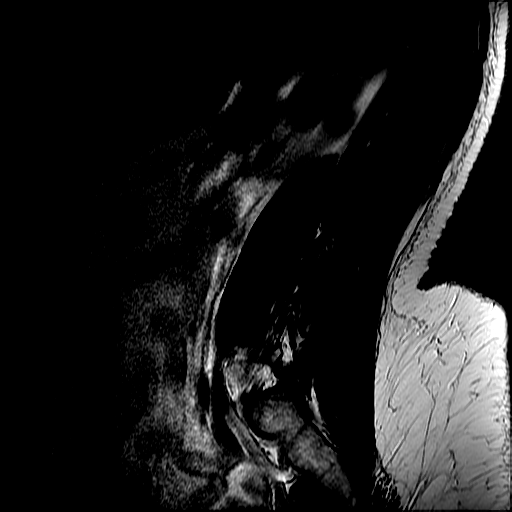
[im 3/12]
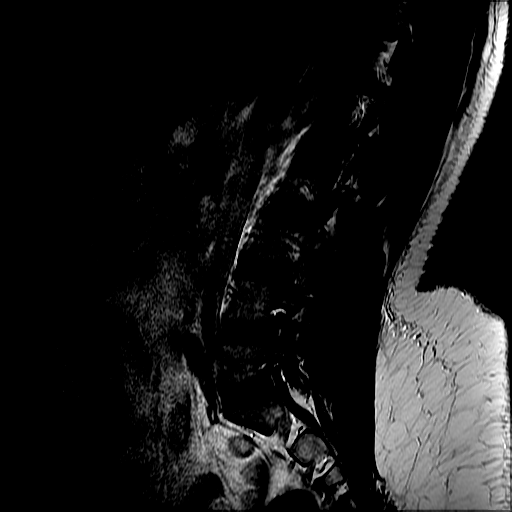
[im 6/12]
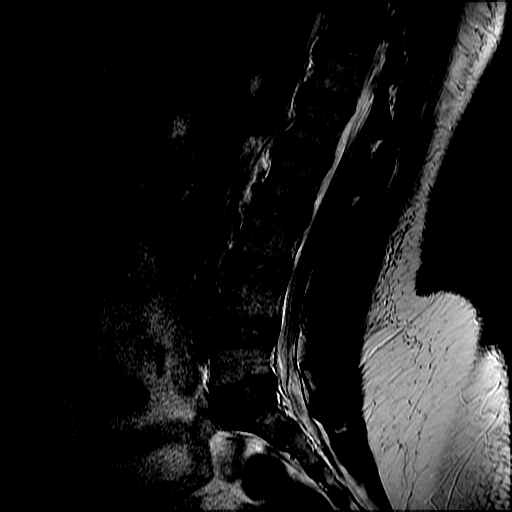
[im 9/12]
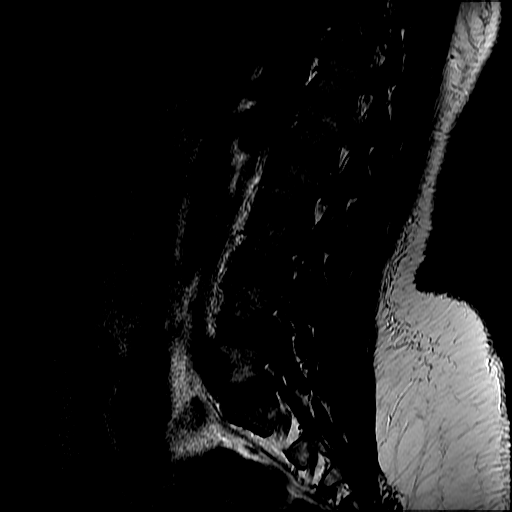
[im 12/12]
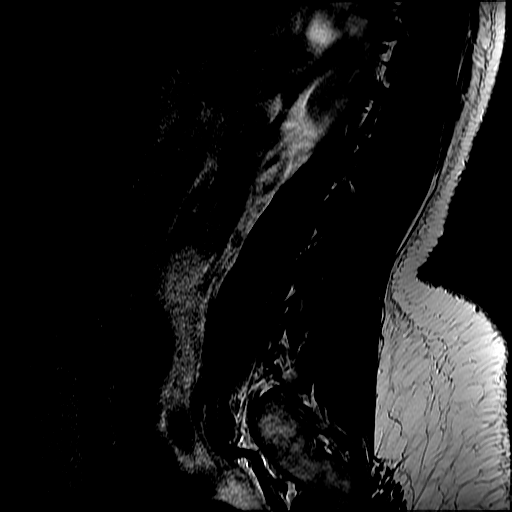

[Series 6: T2 · axial · 4.0mm · 0.39mm/px · z∈[-12,+171]mm · 8 of 41 slices shown]
[im 3/41]
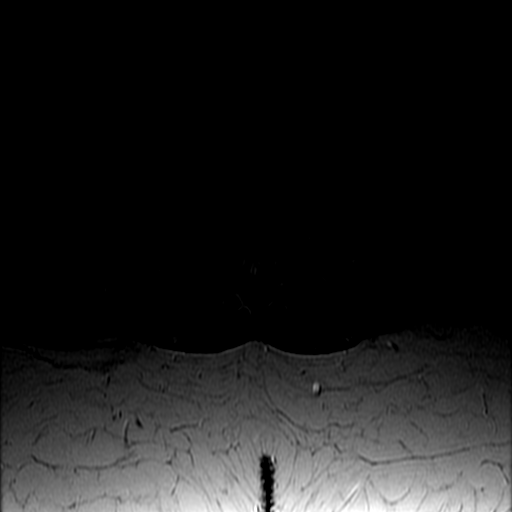
[im 6/41]
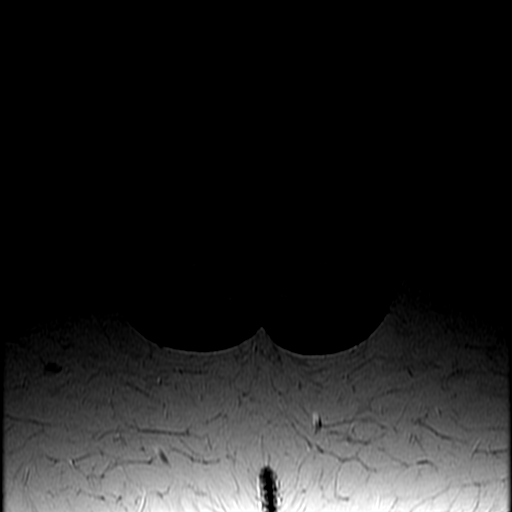
[im 8/41]
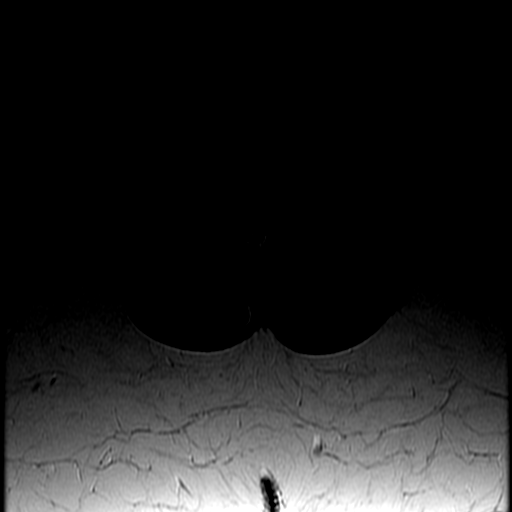
[im 13/41]
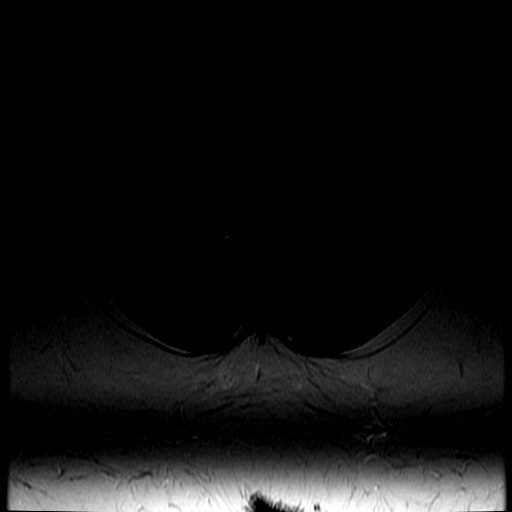
[im 18/41]
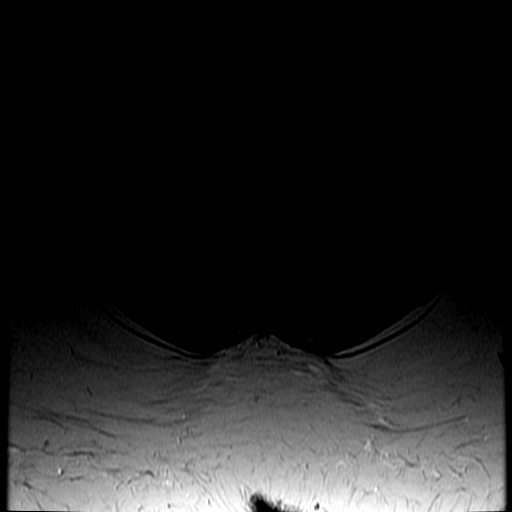
[im 21/41]
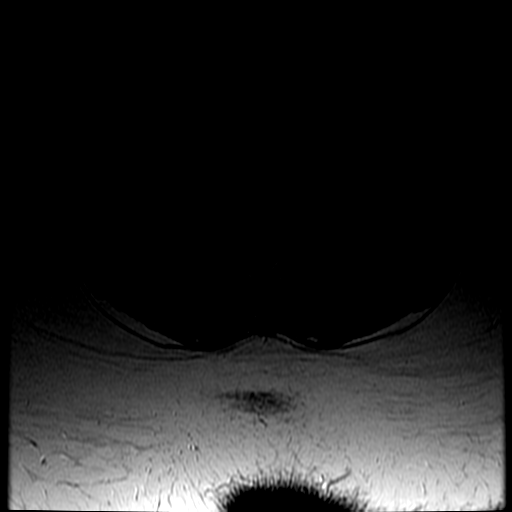
[im 23/41]
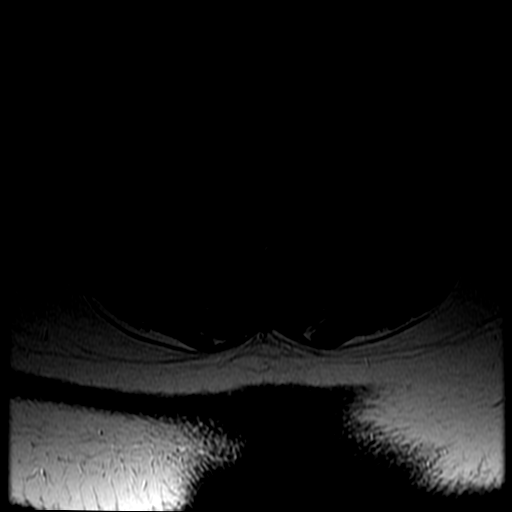
[im 36/41]
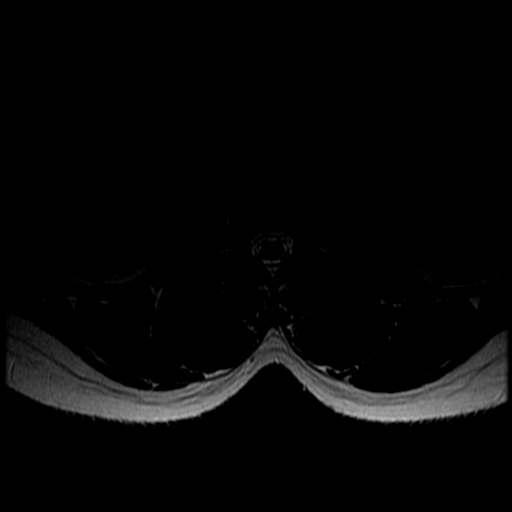

[Series 7: T1 · axial · 4.0mm · 0.39mm/px · z∈[+3,+171]mm · 3 of 41 slices shown (2 of 2)]
[im 6/41]
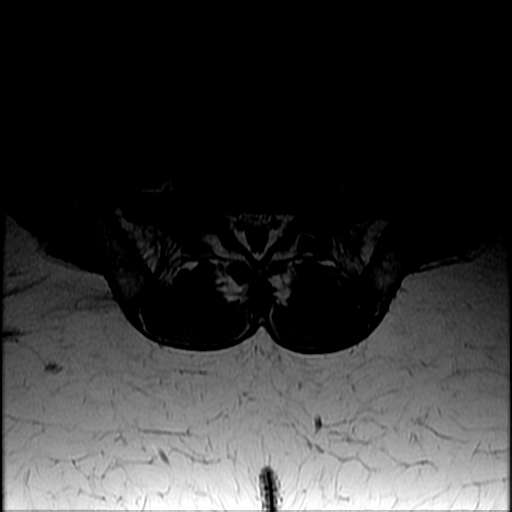
[im 21/41]
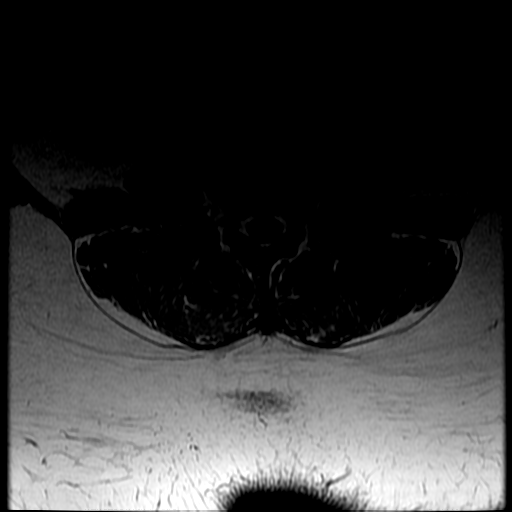
[im 36/41]
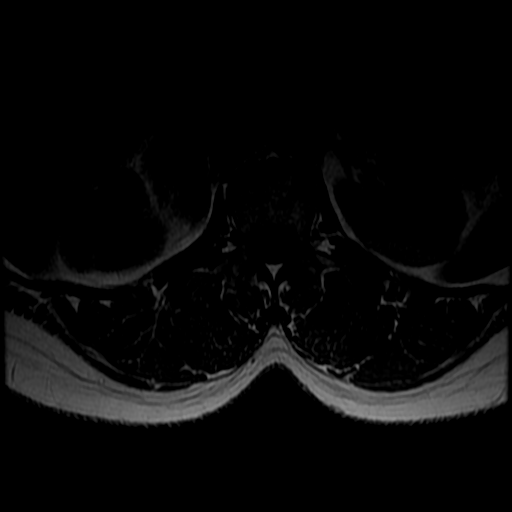

[19 of 48 positions shown; findings below may reference images not displayed]

FINDINGS: Normal lumbar alignment. Negative for fracture or mass lesion. No
bone marrow edema. No soft tissue abnormality. Conus medullaris
normal and terminates at the mid L2

L1-2:  Negative

L2-3: Mild disc bulging with. Mild spinal stenosis. Neural foramina
patent

L3-4: Mild disc bulging and mild facet degeneration. Mild spinal
stenosis

L4-5: Mild disc bulging. Small left foraminal disc protrusion.
Bilateral facet hypertrophy. Mild spinal stenosis. Subarticular
stenosis on the left with mild left foraminal narrowing. Mild right
foraminal narrowing.

L5-S1: Mild disc bulging. Moderate facet hypertrophy bilaterally.
This is causing moderate foraminal narrowing bilaterally.
IMPRESSION: Mild spinal stenosis at L2-3 and L3-4 .

Mild spinal stenosis L4-5 with left foraminal disc protrusion.
Subarticular and foraminal stenosis on the left.

Moderate foraminal encroachment bilaterally L5-S1 due to facet
overgrowth.
